# Patient Record
Sex: Female | Born: 2005 | Hispanic: Yes | Marital: Single | Smoking: Never smoker
Health system: Southern US, Community
[De-identification: ages and names within clinical notes are randomized; demographics above are authoritative.]

## PROBLEM LIST (undated history)

## (undated) ENCOUNTER — Inpatient Hospital Stay: Payer: Self-pay

## (undated) DIAGNOSIS — Z789 Other specified health status: Secondary | ICD-10-CM

## (undated) DIAGNOSIS — F419 Anxiety disorder, unspecified: Secondary | ICD-10-CM

## (undated) DIAGNOSIS — N289 Disorder of kidney and ureter, unspecified: Secondary | ICD-10-CM

## (undated) HISTORY — PX: NO PAST SURGERIES: SHX2092

## (undated) HISTORY — DX: Other specified health status: Z78.9

## (undated) HISTORY — DX: Anxiety disorder, unspecified: F41.9

---
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
## 2023-01-16 NOTE — Addendum Note (Signed)
Addended by: Heywood Bene on: 01/16/2023 01:12 PM   Modules accepted: Orders

## 2023-01-23 ENCOUNTER — Telehealth: Payer: Self-pay

## 2023-01-23 ENCOUNTER — Ambulatory Visit: Payer: Self-pay | Admitting: Advanced Practice Midwife

## 2023-01-23 VITALS — BP 106/68 | HR 105 | Temp 97.1°F | Wt 113.4 lb

## 2023-01-23 DIAGNOSIS — Z3402 Encounter for supervision of normal first pregnancy, second trimester: Secondary | ICD-10-CM

## 2023-01-23 DIAGNOSIS — O2342 Unspecified infection of urinary tract in pregnancy, second trimester: Secondary | ICD-10-CM

## 2023-01-23 DIAGNOSIS — F419 Anxiety disorder, unspecified: Secondary | ICD-10-CM

## 2023-01-23 DIAGNOSIS — O2340 Unspecified infection of urinary tract in pregnancy, unspecified trimester: Secondary | ICD-10-CM

## 2023-01-23 DIAGNOSIS — Z3401 Encounter for supervision of normal first pregnancy, first trimester: Secondary | ICD-10-CM

## 2023-01-23 NOTE — Progress Notes (Signed)
[facility] Spanish Interpretor utilized [place]). Confirmed patient's phone number and contact. Patient states she will set up voice mail. Patient states she did not tale antibiotic for UTI because on bottle there was a note "do not take in pregnancy". Provider notified.   BTHIELE RN

## 2023-01-23 NOTE — Progress Notes (Signed)
[facility] Department Maternal Health Clinic  PRENATAL VISIT NOTE  Subjective:  Taylor Molina is a 17 y.o. G1P0000 at [redacted]w[redacted]d being seen today for ongoing prenatal care.  She is currently monitored for the following issues for this low-risk pregnancy and has Encounter for supervision of normal first pregnancy in first trimester; Underweight BMI=16.7; Anxiety dx'd 2022; Rh negative status during pregnancy; Maternal varicella, non-immune; and UTI (urinary tract infection) during pregnancy 11/17/22 >100,000 E. Coli on their problem list.  Patient reports no complaints.  Contractions: Not present. Vag. Bleeding: None.  Movement: Present. Denies leaking of fluid/ROM.   The following portions of the patient's history were reviewed and updated as appropriate: allergies, current medications, past family history, past medical history, past social history, past surgical history and problem list. Problem list updated.  Objective:   Vitals:   01/23/23 0822  BP: 106/68  Pulse: (!) 105  Temp: (!) 97.1 F (36.2 C)  Weight: 113 lb 6.4 oz (51.4 kg)    Fetal Status: Fetal Heart Rate (bpm): 155 Fundal Height: 17 cm Movement: Present     General:  Alert, oriented and cooperative. Patient is in no acute distress.  Skin: Skin is warm and dry. No rash noted.   Cardiovascular: Normal heart rate noted  Respiratory: Normal respiratory effort, no problems with respiration noted  Abdomen: Soft, gravid, appropriate for gestational age.  Pain/Pressure: Absent     Pelvic: Cervical exam deferred        Extremities: Normal range of motion.  Edema: None  Mental Status: Normal mood and affect. Normal behavior. Normal judgment and thought content.   Assessment and Plan:  Pregnancy: G1P0000 at [redacted]w[redacted]d  1. Encounter for supervision of normal first pregnancy in first trimester 3 lb (1.361 kg) 3 lb wt gain in last 4 wks Nutrition counseling done MNT ordered NIPS 11/17/22=neg Reviewed Last u/s  11/25/22 at 9 1/7 wks with EDC=06/29/23 Anatomy u/s ordered Hasn't made dentist apt yet--strongly encouraged to do so Rising junior at [employer] Here with sister C/o difficulty falling asleep; suggestions given   - AFP, Serum, Open Spina Bifida - US OB Comp + 14 Wk; Future  2. Urinary tract infection in mother during pregnancy, antepartum Dx'd 11/17/22 and Bactrim e-rx'd on 11/21/22 which pt never picked up from pharmacy; counseled pt to begin taking today BID; counseled on potential risks/implications of not treating UTI in pregnancy C&S TOC after tx completed  3. Anxiety dx'd 2022 States wakes up a lot at night; suggestions given Declines interpreter   Preterm labor symptoms and general obstetric precautions including but not limited to vaginal bleeding, contractions, leaking of fluid and fetal movement were reviewed in detail with the patient. Please refer to After Visit Summary for other counseling recommendations.  Return in about 4 weeks (around 02/20/2023) for routine PNC.  Future Appointments  Date Time Provider Department Center  02/20/2023  8:20 AM [facility] PROVIDER [facility] None    Alberteen Spindle, CNM

## 2023-01-23 NOTE — Telephone Encounter (Signed)
Spoke to patient on sister's phone. Patient notified of date, time and location of U/S. Instructions given to arrive at 3:45 with full bladder. Questions answered. Reminded patient o take antibiotics for UTI. Patient asked if it was safe to take in pregnancy. Confirmed with provider that antibiotic safe for her to take. Patient notified that we were unable to leave voice message on her phone. She stated she will set up voice mail. BTHIELE RN

## 2023-01-23 NOTE — Telephone Encounter (Signed)
Patient called utilizing [employer] ID # [phone] to give date, time and location of U/S. Unable to [facility] BTHIELE RN.

## 2023-01-28 ENCOUNTER — Ambulatory Visit: Payer: 59 | Attending: Advanced Practice Midwife

## 2023-02-04 ENCOUNTER — Telehealth: Payer: Self-pay

## 2023-02-04 NOTE — Telephone Encounter (Signed)
Patient aware of rescheduled U/S on Monday 08/12,  arrival time 1545,  with full bladder. Roddie Mc, [facility] Spanish Interpretor, utilized. BTHIELE RN

## 2023-02-04 NOTE — Telephone Encounter (Signed)
Pt called to say that she went for her ultrsound but said they didn't have her on the schedule for an ultrasound, spoke to bonnie about the issue and was told she'd call her back

## 2023-02-09 ENCOUNTER — Ambulatory Visit: Payer: 59 | Attending: Advanced Practice Midwife

## 2023-02-20 ENCOUNTER — Ambulatory Visit: Payer: Self-pay | Admitting: Family Medicine

## 2023-02-20 VITALS — BP 115/66 | HR 109 | Temp 96.7°F | Wt 115.4 lb

## 2023-02-20 DIAGNOSIS — Z3402 Encounter for supervision of normal first pregnancy, second trimester: Secondary | ICD-10-CM

## 2023-02-20 DIAGNOSIS — O2342 Unspecified infection of urinary tract in pregnancy, second trimester: Secondary | ICD-10-CM

## 2023-02-20 DIAGNOSIS — Z3401 Encounter for supervision of normal first pregnancy, first trimester: Secondary | ICD-10-CM

## 2023-02-20 MED ORDER — ASPIRIN 81 MG PO TBEC: 81.0000 mg | DELAYED_RELEASE_TABLET | Freq: Every day | ORAL | Status: DC

## 2023-02-20 NOTE — Assessment & Plan Note (Signed)
Stable, normal vitals. FHR and fundal height within expected ranges.  - counseled ASA 81 mg daily, sample provided per standing order - patient unable to make it to anatomy US x2 due to transportation issues, rescheduled - next prenatal visit in 4 weeks.

## 2023-02-20 NOTE — Progress Notes (Signed)
[name]  PRENATAL VISIT NOTE Subjective:  Taylor Molina is a 17 y.o. G1P0000 at [redacted]w[redacted]d being seen today for ongoing prenatal care.  She is currently monitored for the following issues for this low-risk pregnancy and has Encounter for supervision of normal first pregnancy in first trimester; Underweight BMI=16.7; Anxiety dx'd 2022; Rh negative status during pregnancy; Maternal varicella, non-immune; and UTI (urinary tract infection) during pregnancy 11/17/22 >100,000 E. Coli on their problem list.  Patient reports normal fetal movement.  Denies leaking of fluid/ROM, vaginal bleeding, and contractions.    The following portions of the patient's history were reviewed and updated as appropriate: allergies, current medications, past family history, past medical history, past social history, past surgical history and problem list. Problem list updated.  Objective:   Vitals:   02/20/23 0817  BP: 115/66  Pulse: (!) 109  Temp: (!) 96.7 F (35.9 C)  Weight: 115 lb 6.4 oz (52.3 kg)    Fetal Status: Fetal Heart Rate (bpm): 150 Fundal Height: 22 cm Movement: Present     General:  Alert, oriented and cooperative. Patient is in no acute distress.  Skin: Skin is warm and dry. No rash noted.   Respiratory: Normal respiratory effort, no problems with respiration noted  Abdomen: Soft, gravid, appropriate for gestational age.  Pain/Pressure: Absent     Pelvic: Cervical exam deferred        Extremities: Normal range of motion.     Mental Status: Normal mood and affect. Normal behavior. Normal judgment and thought content.   Assessment and Plan:  Pregnancy: G1P0000 at [redacted]w[redacted]d Emmi was seen today for routine prenatal visit.  Encounter for supervision of normal first pregnancy in first trimester Overview:  Nursing Staff Provider  Office Location  [facility] Dating  On 11/25/22@9  1/7=06/29/23  Language  Spanish Anatomy US    Flu Vaccine  Declined  11/17/2022. Would like next flu season's vaccine Genetic Screen  NIPS: (negative- female) 11/17/22 AFP:  01/23/23=neg; First Screen:  Quad:    TDaP vaccine    Hgb A1C or  GTT Early -N/A Third trimester   COVID vaccine Declined 11/17/2022    Rhogam     LAB RESULTS   Feeding Plan Breastfeeding Blood Type   A negative          (11/17/2022)  Contraception Yes-undecided Antibody  Negative             (11/17/2022)  Circumcision  Rubella  Immune by titer   (11/17/2022)  Pediatrician  Undecided-Peds List given RPR   Non-reactive      (11/17/2022)  Support Person FOB-[facility] HBsAg   Negative           (11/17/2022)  Prenatal Classes  HIV  Non-reactive      (11/17/2022)  @28wk - Doula referral?  Varicella  Non-immune by titer    (11/17/2022)    HCV  Non-reactive       (11/17/2022)  BTL Consent  GBS  (For PCN allergy, check sensitivities)   ASA  Yes Quant Gold Negative             (11/17/2022)  VBAC Consent  Pap  Age 30    Hgb Electro  Normal Hgb        (11/17/2022)  BP Cuff ordered  CF   Delivery Group  AOB SMA   Centering Group  OBCM involved       Assessment & Plan: Stable, normal vitals. FHR and fundal height within expected  ranges.  - counseled ASA 81 mg daily, sample provided per standing order - patient unable to make it to anatomy US x2 due to transportation issues, rescheduled - next prenatal visit in 4 weeks.   Orders: -     Urine Culture -     Aspirin; Take 1 tablet (81 mg total) by mouth daily. Swallow whole.  Urinary tract infection in mother during second trimester of pregnancy Overview: Bactrim DS BID x 7 days per Dr. Alvester Morin- ordered 11/21/22- pt never picked up Pt hasn't started as of 01/23/23--noncompliant; to begin taking on 01/23/23 [ ]  TOC C&S at RV  Assessment & Plan: Clinically resolved. Patient reports she was able to obtain and complete the medication regimen as prescribed without any missed doses. No current urinary symptoms. Will obtain repeat OB urine culture to confirm  eradication of UTI.   Orders: -     Urine Culture   Preterm labor symptoms and general obstetric precautions including but not limited to vaginal bleeding, contractions, leaking of fluid and fetal movement were reviewed in detail with the patient. Please refer to After Visit Summary for other counseling recommendations.  No follow-ups on file.  Future Appointments  Date Time Provider Department Center  03/04/2023 11:00 AM [facility]US [facility]-Outpati  03/20/2023  9:00 AM AC-MH PROVIDER AC-MAT None   Clydene Fake, MD

## 2023-02-20 NOTE — Patient Instructions (Addendum)
I have translated the following text using Google translate.  As such, there are many errors.  I apologize for the poor written translation; however, we do not have written  translation services yet. It was wonderful to meet you today. Thank you for allowing me to be a part of your care. Below is a short summary of what we discussed at your visit today: He traducido el siguiente texto con el traductor de Microbiologist. Por lo tanto, hay muchos errores. Pido disculpas por la mala traduccin escrita; sin embargo, an no contamos con servicios de traduccin escrita. Fue maravilloso conocerte hoy. Gracias por permitirme ser parte de tu atencin. A continuacin, se incluye un breve resumen de lo que hablamos en tu visita de hoy:  Pregnancy Everything is looking good! Next prenatal appointment in 4 weeks. Make this appointment before you leave.   Start taking one aspirin 81 mg tablet every day to help prevent pre-eclampsia. See the hand out for more information.   Please go to your anatomy ultrasound as scheduled. Please call if there are any problems getting there.   Today we collected a urine sample to make sure your urine infection was cured by the antibiotic. If the results are normal, I will send you a letter or MyChart message. If the results are abnormal, I will give you a call.   Embarazo Todo se ve bien! La prxima cita prenatal es en 4 semanas. Haz esta cita antes de irte.  Comienza a tomar una tableta de aspirina de 81 mg [employer] para ayudar a prevenir la preeclampsia. Consulta el folleto para obtener ms informacin.  Acude a tu ecografa anatmica segn lo programado. Llama si tienes algn problema para llegar.  Hoy recolectamos una muestra de orina para asegurarnos de que el antibitico haya curado tu infeccin urinaria. Si los resultados son normales, te enviar una carta o un mensaje de Clinical cytogeneticist. Si los resultados son anormales, te Freight forwarder.  Recurso sobre exposiciones que podran  Loss adjuster, chartered: Mother To Baby es una organizacin sin fines de Visual merchandiser con mucha informacin [employer] efectos de muchos medicamentos y exposiciones en Firefighter. Es de uso gratuito, incluido su sitio web, lnea telefnica, servicio de mensajes de texto, aplicacin o correo electrnico y chat en vivo. [email or web address] Correo electrnico o chat en vivo: MotherToBaby.org Telfono: [phone] Mensaje de texto: [phone] Aplicacin: busque "LactRx"  Resource regarding exposures that could affect pregnancy: Mother To Pecola Le[employer] is a Dentist with lots of information on the effects of many medications and exposures on your pregnancy. It is free to use, including their web site, phone line, text service, app, or email and live chat.  [email or web address]/ Email or live chat: MotherToBaby.org Phone: [phone] Text: [phone] App: search "LactRx"  Recurso sobre exposiciones que podran afectar el embarazo: Mother To Baby es una organizacin sin fines de Visual merchandiser con mucha informacin [employer] efectos de muchos medicamentos y exposiciones en Firefighter. Es de uso gratuito, incluido su sitio web, lnea telefnica, servicio de mensajes de texto, aplicacin o correo electrnico y chat en vivo. [email or web address] Correo electrnico o chat en vivo: MotherToBaby.org Telfono: [phone] Mensaje de texto: [phone] Aplicacin: busque "LactRx"  Maternal Mental Health Salud mental materna Si comienza a desarrollar los siguientes sntomas de depresin, comunquese con nosotros para programar una cita. Tambin hay una lnea directa nacional de salud mental materna al [phone]). Esta lnea directa cuenta con asesores capacitados, doulas y parteras que brindan apoyo, informacin y Occupational hygienist real.  Psychologist, occupational  triste o Designer, multimedia parte del tiempo  Falta de inters en cosas que sola  disfrutar  Menos inters en cuidarse a s misma (vestirse, peinarse)  Dificultad para concentrarse  Dificultad para afrontar las tareas diarias  Preocupacin constante por su beb  Dormir o Microbiologist o muy poco  Sentirse muy ansiosa o nerviosa  Irritabilidad o enojo inexplicables  Pensamientos no deseados o aterradores  Sentirse que no es una buena madre  Pensamientos de Geographical information systems officer dao a s Visual merchandiser o a su beb  Si siente que est atravesando una crisis de salud mental, comunquese con la [employer] de Prevencin del Suicidio al [phone]) o vaya directamente a la A[place]cin de Urgencias de Salud Conductual del [place], Actuary las 24 horas, los 7 das de la [place]. [phone] [place], [place] [address]   Fayette Pho, MD

## 2023-02-20 NOTE — Assessment & Plan Note (Addendum)
Clinically resolved. Patient reports she was able to obtain and complete the medication regimen as prescribed without any missed doses. No current urinary symptoms. Will obtain repeat OB urine culture to confirm eradication of UTI.

## 2023-02-20 NOTE — Progress Notes (Signed)
Patient states did not go to U/S scheduled on 02/09/2023. Pediatrician yet undecided by patient; she states she has Ped List at home. Interpreter utilized [employer] [facility]).  The patient was dispensed Baby Aspirin today. I provided counseling today regarding the medication. We discussed the medication, the side effects and when to call clinic. Patient given the opportunity to ask questions. Questions answered.  Patient aware of rescheduled U/S date, time and location. Map and instructions given.  BTHIELE RN

## 2023-02-22 LAB — URINE CULTURE: Organism ID, Bacteria: NO GROWTH

## 2023-03-04 ENCOUNTER — Ambulatory Visit
Admission: RE | Admit: 2023-03-04 | Discharge: 2023-03-04 | Disposition: A | Discharge status: Home / Self Care | Payer: 59 | Source: Ambulatory Visit | Attending: Advanced Practice Midwife | Admitting: Advanced Practice Midwife

## 2023-03-04 DIAGNOSIS — Z3401 Encounter for supervision of normal first pregnancy, first trimester: Secondary | ICD-10-CM | POA: Diagnosis present

## 2023-03-04 DIAGNOSIS — Z3402 Encounter for supervision of normal first pregnancy, second trimester: Secondary | ICD-10-CM | POA: Insufficient documentation

## 2023-03-04 DIAGNOSIS — Z3689 Encounter for other specified antenatal screening: Secondary | ICD-10-CM | POA: Diagnosis not present

## 2023-03-04 DIAGNOSIS — Z3A22 22 weeks gestation of pregnancy: Secondary | ICD-10-CM | POA: Diagnosis not present

## 2023-03-17 ENCOUNTER — Encounter: Payer: Self-pay | Admitting: Advanced Practice Midwife

## 2023-03-20 ENCOUNTER — Ambulatory Visit: Payer: Self-pay | Admitting: Advanced Practice Midwife

## 2023-03-20 VITALS — BP 106/68 | HR 89 | Temp 98.2°F | Wt 119.0 lb

## 2023-03-20 DIAGNOSIS — Z3401 Encounter for supervision of normal first pregnancy, first trimester: Secondary | ICD-10-CM

## 2023-03-20 DIAGNOSIS — O2340 Unspecified infection of urinary tract in pregnancy, unspecified trimester: Secondary | ICD-10-CM

## 2023-03-20 DIAGNOSIS — Z3402 Encounter for supervision of normal first pregnancy, second trimester: Secondary | ICD-10-CM

## 2023-03-20 DIAGNOSIS — F419 Anxiety disorder, unspecified: Secondary | ICD-10-CM

## 2023-03-20 DIAGNOSIS — R636 Underweight: Secondary | ICD-10-CM

## 2023-03-20 DIAGNOSIS — O2342 Unspecified infection of urinary tract in pregnancy, second trimester: Secondary | ICD-10-CM

## 2023-03-20 NOTE — Progress Notes (Signed)
Patient states she will bring name of Pediatrician next visit. Patient offered Flu vaccine. VIS given however undecided whether will receive this season's flu vaccine. [facility] Spanish Interpreter utilized this visit Sharmon Revere). Patient undecided on method of birth control. BTHIELE RN

## 2023-03-20 NOTE — Progress Notes (Signed)
[facility] Department Maternal Health Clinic  PRENATAL VISIT NOTE  Subjective:  Taylor Molina is a 17 y.o. G1P0000 at [redacted]w[redacted]d being seen today for ongoing prenatal care.  She is currently monitored for the following issues for this low-risk pregnancy and has Encounter for supervision of normal first pregnancy in first trimester; Underweight BMI=16.7; Anxiety dx'd 2022; Rh negative status during pregnancy; Maternal varicella, non-immune; and UTI (urinary tract infection) during pregnancy 11/17/22 >100,000 E. Coli on their problem list.  Patient reports no complaints.  Contractions: Not present. Vag. Bleeding: None.  Movement: Present. Denies leaking of fluid/ROM.   The following portions of the patient's history were reviewed and updated as appropriate: allergies, current medications, past family history, past medical history, past social history, past surgical history and problem list. Problem list updated.  Objective:   Vitals:   03/20/23 0938  BP: 106/68  Pulse: 89  Temp: 98.2 F (36.8 C)  Weight: 119 lb (54 kg)    Fetal Status: Fetal Heart Rate (bpm): 133 Fundal Height: 26 cm Movement: Present     General:  Alert, oriented and cooperative. Patient is in no acute distress.  Skin: Skin is warm and dry. No rash noted.   Cardiovascular: Normal heart rate noted  Respiratory: Normal respiratory effort, no problems with respiration noted  Abdomen: Soft, gravid, appropriate for gestational age.  Pain/Pressure: Absent     Pelvic: Cervical exam deferred        Extremities: Normal range of motion.  Edema: None  Mental Status: Normal mood and affect. Normal behavior. Normal judgment and thought content.   Assessment and Plan:  Pregnancy: G1P0000 at [redacted]w[redacted]d  1. Urinary tract infection in mother during pregnancy, antepartum TOC C&S 02/20/23 neg  2. 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 Underweight BMI=16.7 Had MNT apt 03/12/23  4. Anxiety dx'd 2022 Denies need for counseling   Preterm labor symptoms and general obstetric precautions including but not limited to vaginal bleeding, contractions, leaking of fluid and fetal movement were reviewed in detail with the patient. Please refer to After Visit Summary for other counseling recommendations.  Return in about 2 weeks (around 04/03/2023) for 28 week labs, routine PNC.  Future Appointments  Date Time Provider Department Center  04/03/2023 10:00 AM AC-MH PROVIDER AC-MAT None    Alberteen Spindle, CNM

## 2023-04-03 ENCOUNTER — Telehealth: Payer: Self-pay

## 2023-04-03 ENCOUNTER — Ambulatory Visit: Payer: Self-pay | Admitting: Family Medicine

## 2023-04-03 VITALS — BP 122/69 | HR 105 | Temp 97.3°F | Wt 119.8 lb

## 2023-04-03 DIAGNOSIS — Z3401 Encounter for supervision of normal first pregnancy, first trimester: Secondary | ICD-10-CM

## 2023-04-03 DIAGNOSIS — Z362 Encounter for other antenatal screening follow-up: Secondary | ICD-10-CM

## 2023-04-03 DIAGNOSIS — Z6791 Unspecified blood type, Rh negative: Secondary | ICD-10-CM

## 2023-04-03 DIAGNOSIS — Z3403 Encounter for supervision of normal first pregnancy, third trimester: Secondary | ICD-10-CM

## 2023-04-03 DIAGNOSIS — O26893 Other specified pregnancy related conditions, third trimester: Secondary | ICD-10-CM

## 2023-04-03 LAB — HEMOGLOBIN, FINGERSTICK: Hemoglobin: 11.6 g/dL (ref 11.1–15.9)

## 2023-04-03 NOTE — Telephone Encounter (Signed)
Utilized [facility] Spanish Interpreter to call patient (x 2), patient's spouse Durel Salts) and patient's mother nbut unable to leave message. Patient has U/s appointment 04/09/2023 @ 2:30 pm (arrival time 2:15) @ [facility]. BTHIELE RN

## 2023-04-03 NOTE — Progress Notes (Signed)
Patient declines TDAP and Rhogam until mother can accompany her to appointment. Appointment scheduled for next week and f/u return in 2 weeks.  TDAP VIS given. In house lab reviewed during visit. BTHIELE RN

## 2023-04-03 NOTE — Progress Notes (Signed)
[facility] Health Department Maternal Health Clinic  PRENATAL VISIT NOTE  Subjective:  Taylor Molina is a 17 y.o. G1P0000 at [redacted]w[redacted]d being seen today for ongoing prenatal care.  She is currently monitored for the following issues for this low-risk pregnancy and has Encounter for supervision of normal first pregnancy in first trimester; Underweight BMI=16.7; Anxiety dx'd 2022; Rh negative status during pregnancy; Maternal varicella, non-immune; and UTI (urinary tract infection) during pregnancy 11/17/22 >100,000 E. Coli on their problem list.  Patient reports no complaints.  Contractions: Not present. Vag. Bleeding: None.  Movement: Present. Denies leaking of fluid/ROM.   The following portions of the patient's history were reviewed and updated as appropriate: allergies, current medications, past family history, past medical history, past social history, past surgical history and problem list. Problem list updated.  Objective:   Vitals:   04/03/23 1020  BP: 122/69  Pulse: (!) 105  Temp: (!) 97.3 F (36.3 C)  Weight: 119 lb 12.8 oz (54.3 kg)    Fetal Status: Fetal Heart Rate (bpm): 155 Fundal Height: 28 cm Movement: Present     General:  Alert, oriented and cooperative. Patient is in no acute distress.  Skin: Skin is warm and dry. No rash noted.   Cardiovascular: Normal heart rate noted  Respiratory: Normal respiratory effort, no problems with respiration noted  Abdomen: Soft, gravid, appropriate for gestational age.  Pain/Pressure: Absent     Pelvic: Cervical exam deferred        Extremities: Normal range of motion.  Edema: None  Mental Status: Normal mood and affect. Normal behavior. Normal judgment and thought content.   Assessment and Plan:  Pregnancy: G1P0000 at [redacted]w[redacted]d  1. Encounter for supervision of normal first pregnancy in first trimester Pt reports not taking PNV or ASA because she was "bored" of taking it every day. Reinforced the importance taking  daily. Pt asks if she will have a vaginal delivery or c-section. Discussed indications for c-section and defaulting to vaginal delivery. Pt declines TDaP at this visit. Would like her mother to be present. Also discussed that FOB should receive TDaP as he has not had it in the last 10 years. Reviewed insufficient heart views on [place] and sending r/p [place] today. Pt verbalized understanding. 9 lb 6.4 oz (4.264 kg) 0lb weight gain from last visit Pt endorses eating and snacking with fruit. Encouraged eating large healthy meals with protein. Given examples of protein. Pt had MNT visit 03/12/23  Please give PHQ-9 and CCNC at next visit.  - Antibody screen - Glucose, 1 hour gestational - RPR - HIV-1/HIV-2 Qualitative RNA - Hemoglobin, venipuncture  2. Rh negative status during pregnancy in third trimester Pt declines rhogam at this time because she would like her mother to be present. Discussed what rhogam is and why it is important. Given strict bleeding precautions. - Antibody screen   Preterm labor symptoms and general obstetric precautions including but not limited to vaginal bleeding, contractions, leaking of fluid and fetal movement were reviewed in detail with the patient. Please refer to After Visit Summary for other counseling recommendations.  Return in about 1 week (around 04/10/2023) for Routine PNC, TDaP and rhogam.  Due to language barrier, an interpreter Sharmon Revere) was present during the history-taking and subsequent discussion (and for part of the physical exam) with this patient.   Future Appointments  Date Time Provider Department Center  04/07/2023  3:15 PM AC-MH NURSE AC-MAT None  04/17/2023  8:20 AM AC-MH PROVIDER AC-MAT None  Alicia Amel, NP

## 2023-04-05 LAB — HIV-1/HIV-2 QUALITATIVE RNA
HIV-1 RNA, Qualitative: NONREACTIVE
HIV-2 RNA, Qualitative: NONREACTIVE

## 2023-04-05 LAB — GLUCOSE, 1 HOUR GESTATIONAL: Gestational Diabetes Screen: 71 mg/dL (ref 70–139)

## 2023-04-05 LAB — RPR: RPR Ser Ql: NONREACTIVE

## 2023-04-05 LAB — ANTIBODY SCREEN: Antibody Screen: NEGATIVE

## 2023-04-07 ENCOUNTER — Other Ambulatory Visit: Payer: Self-pay

## 2023-04-07 NOTE — Telephone Encounter (Signed)
Attempted to reach patient utilizing Spanish Interpretor [facility] ID # [phone]). No voice mail set up for patient or her significant other phone # Durel Salts). Patient needs date, time and location of U./S 04/09/2023. BTHIELE RN

## 2023-04-07 NOTE — Telephone Encounter (Signed)
Client [facility] for Tdap and Rhophylac in Nurse Clinic 04/07/23. Call to client with [employer] to reschedule missed appt AND notify her of 04/09/23 [place] appt. Per recorded message on voice mail of client / partner / mother, number is currently not available. [facility] then had fire alarm prior to calling another emergency contact.  On return to clinic after fire drill, call to client with [employer] and able to contact sister. Requested sister assist RN in contacting client to call for [place] appt and to reschedule today's missed appt for Tdap / Rhophylac. Number to call provided. [name], RN

## 2023-04-08 NOTE — Telephone Encounter (Signed)
Note received from Carren Rang this am that client had returned call from yesterday and was waiting on RN call at [phone]. Call made to client at that number with Est[employer] Dooms and per recorded message, number is not available. Re-dialed number with same message. Call then made to emergency contact (sister) who states client is with her.  Client counseled regarding 04/09/23 [place] appt with arrival time of 2:15 pm. [facility] address provided to client.  Yesterday's missed appt for Tdap and Rhophylac also rescheduled for Monday (client preference), 10//14/24 in Nurse Clinic. [name], RN

## 2023-04-09 ENCOUNTER — Ambulatory Visit
Admission: RE | Admit: 2023-04-09 | Discharge: 2023-04-09 | Disposition: A | Discharge status: Home / Self Care | Payer: 59 | Source: Ambulatory Visit | Attending: Nurse Practitioner | Admitting: Nurse Practitioner

## 2023-04-09 ENCOUNTER — Other Ambulatory Visit: Payer: Self-pay | Admitting: Nurse Practitioner

## 2023-04-09 DIAGNOSIS — Z362 Encounter for other antenatal screening follow-up: Secondary | ICD-10-CM | POA: Diagnosis present

## 2023-04-09 DIAGNOSIS — Z6791 Unspecified blood type, Rh negative: Secondary | ICD-10-CM

## 2023-04-09 DIAGNOSIS — Z3401 Encounter for supervision of normal first pregnancy, first trimester: Secondary | ICD-10-CM

## 2023-04-13 ENCOUNTER — Ambulatory Visit: Payer: Self-pay | Admitting: Family Medicine

## 2023-04-13 ENCOUNTER — Ambulatory Visit: Payer: Self-pay

## 2023-04-13 ENCOUNTER — Other Ambulatory Visit: Payer: Self-pay

## 2023-04-13 VITALS — BP 127/75 | HR 107 | Temp 97.3°F | Wt 119.0 lb

## 2023-04-13 DIAGNOSIS — O26893 Other specified pregnancy related conditions, third trimester: Secondary | ICD-10-CM

## 2023-04-13 DIAGNOSIS — Z6791 Unspecified blood type, Rh negative: Secondary | ICD-10-CM

## 2023-04-13 DIAGNOSIS — Z3401 Encounter for supervision of normal first pregnancy, first trimester: Secondary | ICD-10-CM

## 2023-04-13 MED ORDER — RHO D IMMUNE GLOBULIN 1500 UNIT/2ML IJ SOSY
300.0000 ug | PREFILLED_SYRINGE | Freq: Once | INTRAMUSCULAR | Status: AC
Start: 2023-04-13 — End: 2023-04-13
  Administered 2023-04-13: 300 ug via INTRAMUSCULAR

## 2023-04-13 NOTE — Progress Notes (Unsigned)
Patient declined TDAp until next visit. Patient tearful prior to Rhophylac injection, emotional support given. Patient tolerated injection well. BTHIELE RN

## 2023-04-14 ENCOUNTER — Telehealth: Payer: Self-pay

## 2023-04-14 NOTE — Telephone Encounter (Signed)
Call to client with Marlene Yemen as noted she did not have a [facility] RV follow-up appt. Per client, she has an appt 04/17/23 at 0800 written on her white card. Client scheduled for that date / time. [name], RN

## 2023-04-17 ENCOUNTER — Ambulatory Visit: Payer: Self-pay | Admitting: Advanced Practice Midwife

## 2023-04-17 ENCOUNTER — Ambulatory Visit: Payer: Self-pay

## 2023-04-17 VITALS — BP 115/71 | HR 99 | Temp 97.0°F | Wt 122.8 lb

## 2023-04-17 DIAGNOSIS — R636 Underweight: Secondary | ICD-10-CM

## 2023-04-17 DIAGNOSIS — O26899 Other specified pregnancy related conditions, unspecified trimester: Secondary | ICD-10-CM

## 2023-04-17 DIAGNOSIS — Z3401 Encounter for supervision of normal first pregnancy, first trimester: Secondary | ICD-10-CM

## 2023-04-17 DIAGNOSIS — Z6791 Unspecified blood type, Rh negative: Secondary | ICD-10-CM

## 2023-04-17 DIAGNOSIS — Z3403 Encounter for supervision of normal first pregnancy, third trimester: Secondary | ICD-10-CM

## 2023-04-17 NOTE — Progress Notes (Signed)
[facility] Health Department Maternal Health Clinic  PRENATAL VISIT NOTE  Subjective:  Taylor Molina is a 17 y.o. G1P0000 at [redacted]w[redacted]d being seen today for ongoing prenatal care accompanied by FOB.  She is currently monitored for the following issues for this low-risk pregnancy and has Encounter for supervision of normal first pregnancy in first trimester; Underweight BMI=16.7; Anxiety dx'd 2022; Rh negative status during pregnancy; Maternal varicella, non-immune; and UTI (urinary tract infection) during pregnancy 11/17/22 >100,000 E. Coli on their problem list.  Patient reports no complaints.  Contractions: Not present. Vag. Bleeding: None.  Movement: Present. She reports that the baby moves a lot and wakes her up at 3am, but she is able to get back to sleep about an hour later. Denies leaking of fluid/ROM.   The following portions of the patient's history were reviewed and updated as appropriate: allergies, current medications, past family history, past medical history, past social history, past surgical history and problem list. Problem list updated.  Objective:   Vitals:   04/17/23 0824  BP: 115/71  Pulse: 99  Temp: (!) 97 F (36.1 C)  Weight: 122 lb 12.8 oz (55.7 kg)    Fetal Status: Fetal Heart Rate (bpm): 140 Fundal Height: 29 cm Movement: Present     General:  Alert, oriented and cooperative. Patient is in no acute distress.  Skin: Skin is warm and dry. No rash noted.   Cardiovascular: Normal heart rate noted  Respiratory: Normal respiratory effort, no problems with respiration noted  Abdomen: Soft, gravid, appropriate for gestational age.  Pain/Pressure: Absent     Pelvic: Cervical exam deferred        Extremities: Normal range of motion.  Edema: None  Mental Status: Normal mood and affect. Normal behavior. Normal judgment and thought content.   Assessment and Plan:  Pregnancy: G1P0000 at [redacted]w[redacted]d  1. Encounter for supervision of normal first pregnancy in first  trimester 12 lb 6.4 oz (5.625 kg) Gained 3lbs since last visit 2 weeks ago Reviewed 04/09/23 [place] with patient. EFW 35% AFI 12.5 cm, baby breech. Discussed that they were still unable to visualize RVOT, offered to send pt to MFM to confirm normal heart views. Pt and FOB unsure. Asked pt to let us know if she would like another r/p [place] for heart views, will wait to send referral until pt requests. Reviewed 28 wk labs w pt. TDaP given. Encouraged FOB to get TDaP as well. Taking ASA and PNV daily.  2. Rh negative, antepartum Rhogam given 10/14  3. Underweight BMI=16.7   Preterm labor symptoms and general obstetric precautions including but not limited to vaginal bleeding, contractions, leaking of fluid and fetal movement were reviewed in detail with the patient. Please refer to After Visit Summary for other counseling recommendations.  Due to language barrier, an interpreter, Roddie Mc, was present during the history-taking and subsequent discussion (and for part of the physical exam) with this patient.  Return in about 2 weeks (around 05/01/2023) for Routine PNC.  Future Appointments  Date Time Provider Department Center  05/01/2023  9:00 AM AC-MH PROVIDER AC-MAT None    Alicia Amel, NP

## 2023-04-28 NOTE — Addendum Note (Signed)
Addended by: Heywood Bene on: 04/28/2023 10:35 AM   Modules accepted: Orders

## 2023-05-01 ENCOUNTER — Ambulatory Visit: Payer: Self-pay | Admitting: Family Medicine

## 2023-05-01 VITALS — BP 126/72 | HR 101 | Temp 97.4°F | Wt 124.0 lb

## 2023-05-01 DIAGNOSIS — Z3401 Encounter for supervision of normal first pregnancy, first trimester: Secondary | ICD-10-CM

## 2023-05-01 DIAGNOSIS — Z3403 Encounter for supervision of normal first pregnancy, third trimester: Secondary | ICD-10-CM

## 2023-05-01 DIAGNOSIS — Z3A31 31 weeks gestation of pregnancy: Secondary | ICD-10-CM

## 2023-05-01 DIAGNOSIS — R636 Underweight: Secondary | ICD-10-CM

## 2023-05-01 NOTE — Progress Notes (Signed)
[facility] Health Department Maternal Health Clinic  PRENATAL VISIT NOTE  Subjective:  Taylor Molina is a 17 y.o. G1P0000 at [redacted]w[redacted]d being seen today for ongoing prenatal care.  She is currently monitored for the following issues for this low-risk pregnancy and has Encounter for supervision of normal first pregnancy in first trimester; Underweight BMI=16.7; Anxiety dx'd 2022; Rh negative status during pregnancy; Maternal varicella, non-immune; and UTI (urinary tract infection) during pregnancy 11/17/22 >100,000 E. Coli on their problem list.  Patient reports no complaints.  Contractions: Not present. Vag. Bleeding: None.  Movement: Present. Denies leaking of fluid/ROM.   The following portions of the patient's history were reviewed and updated as appropriate: allergies, current medications, past family history, past medical history, past social history, past surgical history and problem list. Problem list updated.  Objective:   Vitals:   05/01/23 0859  BP: 126/72  Pulse: (!) 101  Temp: (!) 97.4 F (36.3 C)  Weight: 124 lb (56.2 kg)    Fetal Status: Fetal Heart Rate (bpm): 154 Fundal Height: 30 cm Movement: Present     General:  Alert, oriented and cooperative. Patient is in no acute distress.  Skin: Skin is warm and dry. No rash noted.   Cardiovascular: Normal heart rate noted  Respiratory: Normal respiratory effort, no problems with respiration noted  Abdomen: Soft, gravid, appropriate for gestational age.  Pain/Pressure: Absent     Pelvic: Cervical exam deferred        Extremities: Normal range of motion.  Edema: None  Mental Status: Normal mood and affect. Normal behavior. Normal judgment and thought content.   Assessment and Plan:  Pregnancy: G1P0000 at [redacted]w[redacted]d  1. Encounter for supervision of normal first pregnancy in first trimester -reports taking PNV and ASA daily 13 lb 9.6 oz (6.169 kg) -has not gotten crib or car seat yet- encouraged to do so  soon -reviewed having a bag packed for hospital by 36 weeks -I did not discuss with patient if they desire repeat US- has already had two but poor visualization of certain structures, ask at RV  2. Underweight BMI=16.7 -FH appropriate, has gained 2 # in the last 2 weeks  3. [redacted] weeks gestation of pregnancy    Preterm labor symptoms and general obstetric precautions including but not limited to vaginal bleeding, contractions, leaking of fluid and fetal movement were reviewed in detail with the patient. Please refer to After Visit Summary for other counseling recommendations.  Return in about 2 weeks (around 05/15/2023) for Routine Prenatal Care.  No future appointments. Due to language barrier, interpreter Estill Dooms was present for this visit.  Lenice Llamas, [place]

## 2023-05-01 NOTE — Progress Notes (Addendum)
Declined Flu vaccine.  BTHIELE RN

## 2023-05-15 ENCOUNTER — Ambulatory Visit: Payer: Self-pay | Admitting: Family Medicine

## 2023-05-15 ENCOUNTER — Telehealth: Payer: Self-pay

## 2023-05-15 VITALS — BP 115/70 | HR 115 | Temp 97.3°F | Wt 123.8 lb

## 2023-05-15 DIAGNOSIS — R636 Underweight: Secondary | ICD-10-CM

## 2023-05-15 DIAGNOSIS — Z3403 Encounter for supervision of normal first pregnancy, third trimester: Secondary | ICD-10-CM

## 2023-05-15 DIAGNOSIS — Z3401 Encounter for supervision of normal first pregnancy, first trimester: Secondary | ICD-10-CM

## 2023-05-15 NOTE — Progress Notes (Signed)
MFM [place] appt scheduled and will notify client of appt via phone call. [name], RN

## 2023-05-15 NOTE — Telephone Encounter (Signed)
Call to client with [facility] Interpreters ID [phone] to notify her of 06/10/23 [place] at 0800 with arrival time of 0745. Appt is at [facility] in [place]. This is first available appt at both MFM locations. Per recorded message, number is not available and no voicemail set up. Call then made to emergency contacts (partner, sister, mother) and per recorded message on each phone, number is not available. [name], RN

## 2023-05-15 NOTE — Addendum Note (Signed)
Addended by: Lenice Llamas on: 05/15/2023 11:09 AM   Modules accepted: Orders

## 2023-05-15 NOTE — Progress Notes (Signed)
[facility] Health Department Maternal Health Clinic  PRENATAL VISIT NOTE  Subjective:  Taylor Molina is a 17 y.o. G1P0000 at [redacted]w[redacted]d being seen today for ongoing prenatal care.  She is currently monitored for the following issues for this low-risk pregnancy and has Encounter for supervision of normal first pregnancy in first trimester; Underweight BMI=16.7; Anxiety dx'd 2022; Rh negative status during pregnancy; Maternal varicella, non-immune; and UTI (urinary tract infection) during pregnancy 11/17/22 >100,000 E. Coli on their problem list.  Patient reports no complaints.  Contractions: Not present. Vag. Bleeding: None.  Movement: Present. Denies leaking of fluid/ROM.   The following portions of the patient's history were reviewed and updated as appropriate: allergies, current medications, past family history, past medical history, past social history, past surgical history and problem list. Problem list updated.  Objective:   Vitals:   05/15/23 1011 05/15/23 1050  BP: 115/70   Pulse: (!) 129 (!) 115  Temp: (!) 97.3 F (36.3 C)   Weight: 123 lb 12.8 oz (56.2 kg)     Fetal Status: Fetal Heart Rate (bpm): 139 Fundal Height: 31 cm Movement: Present     General:  Alert, oriented and cooperative. Patient is in no acute distress.  Skin: Skin is warm and dry. No rash noted.   Cardiovascular: Normal heart rate noted  Respiratory: Normal respiratory effort, no problems with respiration noted  Abdomen: Soft, gravid, appropriate for gestational age.  Pain/Pressure: Absent     Pelvic: Cervical exam deferred        Extremities: Normal range of motion.  Edema: None  Mental Status: Normal mood and affect. Normal behavior. Normal judgment and thought content.   Assessment and Plan:  Pregnancy: G1P0000 at [redacted]w[redacted]d  1. Encounter for supervision of normal first pregnancy in first trimester -Pt reports she got the crib and car seat since the last visit  -last two ultrasounds have had  poor visualization of certain structures (RVOT) - gone twice to [facility]- offered repeat US- reviewed cost- patient decided she would like to have another [place] at a different place- will order to [facility]- patient is worried abotu the cost and after some debate- decides she wants it ordered  -HR elevated on initial VS- at 129, lowered down to 115 at end of visit, this is closer to patient's baseline -taking PNV daily  2. Underweight BMI=16.7 13 lb 6.4 oz (6.078 kg) -lost a little bit of weight- denies NV, and reports she is eating well -encouraged to increase protein and healthy fat intake    Preterm labor symptoms and general obstetric precautions including but not limited to vaginal bleeding, contractions, leaking of fluid and fetal movement were reviewed in detail with the patient. Please refer to After Visit Summary for other counseling recommendations.  No follow-ups on file.  Future Appointments  Date Time Provider Department Center  06/01/2023  9:00 AM AC-MH PROVIDER AC-MAT None   Due to language barrier, interpreter Daniella [phone]) was present for this visit.  Lenice Llamas, [place]

## 2023-05-20 NOTE — Telephone Encounter (Signed)
Call to client with Taylor Molina and provided [place] appt of 06/10/23 at 0800 with arrival time of 0745 at [facility] in [place]. Client provided facility name and given address of [facility]. Following call, RN realized office  / [id number] not provided to client and [name] calling client to give her this information. [name], RN

## 2023-06-01 ENCOUNTER — Encounter: Payer: Self-pay | Admitting: Family Medicine

## 2023-06-01 ENCOUNTER — Ambulatory Visit: Payer: Self-pay | Admitting: Family Medicine

## 2023-06-01 VITALS — BP 113/68 | HR 103 | Temp 98.0°F | Wt 126.8 lb

## 2023-06-01 DIAGNOSIS — Z3403 Encounter for supervision of normal first pregnancy, third trimester: Secondary | ICD-10-CM

## 2023-06-01 DIAGNOSIS — Z3401 Encounter for supervision of normal first pregnancy, first trimester: Secondary | ICD-10-CM

## 2023-06-01 DIAGNOSIS — O2343 Unspecified infection of urinary tract in pregnancy, third trimester: Secondary | ICD-10-CM

## 2023-06-01 NOTE — Patient Instructions (Addendum)
I have translated the following text using Google translate.  As such, there are many errors.  I apologize for the poor written translation; however, we do not have written  translation services yet. It was wonderful to see you today. Thank you for allowing me to be a part of your care. Below is a short summary of what we discussed at your visit today: He traducido el siguiente texto con el traductor de Microbiologist. Por lo tanto, hay muchos errores. Pido disculpas por la mala traduccin escrita; sin embargo, an no contamos con servicios de traduccin escrita. Fue maravilloso verte hoy. Gracias por permitirme ser parte de tu atencin. A continuacin, se incluye un breve resumen de lo que hablamos en tu visita de hoy:  Pregnancy Swabs were collected today to test for Group B strep bacteria, gonorrhea, and chlamydia.  You will need weekly appointments until you deliver.  Keep taking your prenatal vitamins.  Sherlon Handing se tomaron muestras para Landscape architect presencia de bacterias del estreptococo del [place] B, gonorrea y clamidia. Necesitars citas semanales hasta el parto. Sigue tomando tus vitaminas prenatales.  Prenatal Classes Go to [email or web address] for more information on the pregnancy and child birth classes that [facility] has to offer.   Clases prenatales Visita [email or web address] para obtener ms informacin sobre las clases de Psychiatrist y parto que ofrece [place].  Maternal Mental Health Salud Mental Materna Si comienza a desarrollar los siguientes sntomas de depresin, comunquese con nosotros para programar una cita. Tambin hay una lnea directa nacional de salud mental materna en el [phone]). Esta lnea directa ha capacitado a consejeros, doulas y parteras para brindar apoyo, informacin y Occupational hygienist real.  [place] Sizer triste o sin esperanza la mayor parte del tiempo  Falta de inters  en las cosas que sola disfrutar  Menos inters en cuidarse a s mismo (vestirse, arreglarse el cabello)  Problemas para concentrarse  Problemas para hacer frente a las tareas diarias  Preocupacin constante por su beb  Dormir o Microbiologist o muy poco  Sentirse muy ansioso o nervioso  Irritabilidad o ira inexplicables  Pensamientos no deseados o Insurance underwriter que no eres una buena madre  Pensamientos de Runner, broadcasting/film/video a s Visual merchandiser o a su beb  Si cree que est experimentando una crisis de salud mental, comunquese con la [employer] de Prevencin del Suicidio al 1-800-273-TALK [phone]).    Fayette Pho, MD [facility] Department

## 2023-06-01 NOTE — Assessment & Plan Note (Signed)
TOC negative August 2024, will resolve from active problem list.

## 2023-06-01 NOTE — Assessment & Plan Note (Signed)
Taking asa and PNV. Doing well. BP normal. TWG 16 lb. GBS and GC/CT swabs collected today. Has car seat and crib.

## 2023-06-01 NOTE — Progress Notes (Signed)
Patient here for MH RV at 36 weeks. 36 weeks labs and packet today. Patient self-collecting labs. Burt Knack, RN   Patient decided she would like provider to collect labs today. Aware of 06/10/23 U/S at [facility] MFM.Marland KitchenBurt Knack, RN

## 2023-06-01 NOTE — Progress Notes (Signed)
[facility] Department Maternal Health Clinic  PRENATAL VISIT NOTE Phone Spanish interpreter Shawna Orleans WG#[id number] used for visit Subjective:  Taylor Molina is a 17 y.o. G1P0000 at [redacted]w[redacted]d being seen today for ongoing prenatal care.  She is currently monitored for the following issues for this low-risk pregnancy and has Encounter for supervision of normal first pregnancy in first trimester; Underweight BMI=16.7; Anxiety dx'd 2022; Rh negative status during pregnancy; and Maternal varicella, non-immune on their problem list.  Patient reports no complaints.  Contractions: Not present. Vag. Bleeding: None.  Movement: Present. Denies leaking of fluid/ROM.   The following portions of the patient's history were reviewed and updated as appropriate: allergies, current medications, past family history, past medical history, past social history, past surgical history and problem list. Problem list updated.  Objective:   Vitals:   06/01/23 0908  BP: 113/68  Pulse: (!) 103  Temp: 98 F (36.7 C)  Weight: 126 lb 12.8 oz (57.5 kg)   Fetal Status: Fetal Heart Rate (bpm): 156 Fundal Height: 35 cm Movement: Present     General:  Alert, oriented and cooperative. Patient is in no acute distress.  Skin: Skin is warm and dry. No rash noted.   Cardiovascular: Normal heart rate noted  Respiratory: Normal respiratory effort, no problems with respiration noted  Abdomen: Soft, gravid, appropriate for gestational age.  Pain/Pressure: Present (positional, relieved with movement)     Pelvic: Cervical exam deferred        Extremities: Normal range of motion.     Mental Status: Normal mood and affect. Normal behavior. Normal judgment and thought content.   Assessment and Plan:  Pregnancy: G1P0000 at [redacted]w[redacted]d  Encounter for supervision of normal first pregnancy in first trimester Assessment & Plan: Taking asa and PNV. Doing well. BP normal. TWG 16 lb. GBS and GC/CT swabs collected today. Has car  seat and crib.   Orders: -     Chlamydia/GC NAA, Confirmation -     Culture, beta strep (group b only)  Urinary tract infection in mother during third trimester of pregnancy Assessment & Plan: TOC negative August 2024, will resolve from active problem list.    Term labor symptoms and general obstetric precautions including but not limited to vaginal bleeding, contractions, leaking of fluid and fetal movement were reviewed in detail with the patient. Please refer to After Visit Summary for other counseling recommendations.  No follow-ups on file.  Future Appointments  Date Time Provider Department Center  06/10/2023  8:00 AM [facility]   Clydene Fake, MD

## 2023-06-05 LAB — CHLAMYDIA/GC NAA, CONFIRMATION
Chlamydia trachomatis, NAA: NEGATIVE
Neisseria gonorrhoeae, NAA: NEGATIVE

## 2023-06-05 LAB — CULTURE, BETA STREP (GROUP B ONLY): Strep Gp B Culture: NEGATIVE

## 2023-06-07 ENCOUNTER — Observation Stay
Admission: EM | Admit: 2023-06-07 | Discharge: 2023-06-08 | Disposition: A | Discharge status: Home / Self Care | Payer: 59 | Attending: Certified Nurse Midwife | Admitting: Certified Nurse Midwife

## 2023-06-07 ENCOUNTER — Other Ambulatory Visit: Payer: Self-pay

## 2023-06-07 ENCOUNTER — Encounter: Payer: Self-pay | Admitting: Obstetrics and Gynecology

## 2023-06-07 DIAGNOSIS — O4693 Antepartum hemorrhage, unspecified, third trimester: Secondary | ICD-10-CM | POA: Diagnosis present

## 2023-06-07 DIAGNOSIS — O26893 Other specified pregnancy related conditions, third trimester: Principal | ICD-10-CM

## 2023-06-07 DIAGNOSIS — Z6791 Unspecified blood type, Rh negative: Principal | ICD-10-CM

## 2023-06-07 DIAGNOSIS — Z3A37 37 weeks gestation of pregnancy: Secondary | ICD-10-CM | POA: Insufficient documentation

## 2023-06-07 DIAGNOSIS — Z2839 Other underimmunization status: Secondary | ICD-10-CM

## 2023-06-07 LAB — URINALYSIS, ROUTINE W REFLEX MICROSCOPIC
Bilirubin Urine: NEGATIVE
Glucose, UA: NEGATIVE mg/dL
Ketones, ur: 80 mg/dL — AB
Nitrite: NEGATIVE
Protein, ur: NEGATIVE mg/dL
Specific Gravity, Urine: 1.015 (ref 1.005–1.030)
pH: 7 (ref 5.0–8.0)

## 2023-06-07 LAB — URINALYSIS, MICROSCOPIC (REFLEX): RBC / HPF: >50 RBC/hpf (ref 0–5)

## 2023-06-07 MED ORDER — ACETAMINOPHEN 500 MG PO TABS
1000.0000 mg | ORAL_TABLET | Freq: Once | ORAL | Status: DC
Start: 2023-06-07 — End: 2023-06-08

## 2023-06-07 MED ORDER — LACTATED RINGERS IV BOLUS
1000.0000 mL | Freq: Once | INTRAVENOUS | Status: AC
  Administered 2023-06-07: 1000 mL via INTRAVENOUS

## 2023-06-07 MED ORDER — TERBUTALINE SULFATE 1 MG/ML IJ SOLN
INTRAMUSCULAR | Status: AC
  Administered 2023-06-07: 0.25 mg via SUBCUTANEOUS
  Filled 2023-06-07: qty 1

## 2023-06-07 MED ORDER — TERBUTALINE SULFATE 1 MG/ML IJ SOLN: 0.2500 mg | Freq: Once | INTRAMUSCULAR | Status: AC

## 2023-06-07 MED ORDER — LACTATED RINGERS IV SOLN: INTRAVENOUS | Status: DC

## 2023-06-07 NOTE — OB Triage Note (Addendum)
Taylor Molina 17 y.o. G1P0 @ [redacted]w[redacted]d  presents to Labor & Delivery triage via wheelchair steered by ED staff reporting vaginal bleeding starting around 1900. She denies signs and symptoms consistent with rupture of membranes.She denies contractions and states positive fetal movement. External FM and TOCO applied to non-tender abdomen. Initial FHR 145. Ctx every 1-3 mins but patient does not feel them. She did report noticing her belly tightening after the bleeding started. Pt reports sexual intercourse around 1850 right before bleeding started. SVE 1.5 50% -1/0. Vital signs obtained and within normal limits. Patient oriented to care environment including call bell and bed control use. Janee Morn, CNM notified of patient's arrival. Plan to IV hydrate, send UA and recheck in 2 hours.   [name] B. [name], RN BSN 06/07/2023 8:17 PM

## 2023-06-08 ENCOUNTER — Telehealth: Payer: Self-pay

## 2023-06-08 ENCOUNTER — Ambulatory Visit: Payer: Self-pay

## 2023-06-08 DIAGNOSIS — O4693 Antepartum hemorrhage, unspecified, third trimester: Principal | ICD-10-CM

## 2023-06-08 DIAGNOSIS — Z3A37 37 weeks gestation of pregnancy: Secondary | ICD-10-CM | POA: Diagnosis not present

## 2023-06-08 MED ORDER — ZOLPIDEM TARTRATE 5 MG PO TABS: 5.0000 mg | ORAL_TABLET | Freq: Every evening | ORAL | Status: DC | PRN

## 2023-06-08 NOTE — Telephone Encounter (Signed)
TC to patient to reschedule missed MH RV. Unable to LM. TC to emergency contact, unable to LM.Marland KitchenBurt Knack, RN

## 2023-06-08 NOTE — OB Triage Note (Signed)
  L&D OB Triage Note  SUBJECTIVE Taylor Molina is a 17 y.o. G1P0000 female at [redacted]w[redacted]d, EDD Estimated Date of Delivery: 06/29/23 who presented to triage with complaints of vaginal bleeding. She denies contractions , loss of fluid , and is feeling good movement. She admits to having recent intercourse.    OB History  Gravida Para Term Preterm AB Living  1 0 0 0 0 0  SAB IAB Ectopic Multiple Live Births  0 0 0 0 0    # Outcome Date GA Lbr Len/2nd Weight Sex Type Anes PTL Lv  1 Current             Obstetric Comments  Living with FOB, support in labor. Living with FOB and her brothers and nephew.    Medications Prior to Admission  Medication Sig Dispense Refill Last Dose   aspirin EC 81 MG tablet Take 1 tablet (81 mg total) by mouth daily. Swallow whole.   06/06/2023   Prenatal Vit-Fe Fumarate-FA (MULTIVITAMIN-PRENATAL) 27-0.8 MG TABS tablet Take 1 tablet by mouth daily at 12 noon.   06/06/2023     OBJECTIVE  Nursing Evaluation:   BP 114/63 (BP Location: Right Arm)   Pulse (!) 108   Ht 5\' 8"  (1.727 m)   Wt 57.2 kg   LMP 09/01/2022 (Exact Date)   SpO2 100%   BMI 19.16 kg/m    Findings:        Pt contractions, possible early labor       NST was performed and has been reviewed by me.  NST INTERPRETATION: Category I  Mode: External Baseline Rate (A): 160 bpm (fht) Variability: Moderate Accelerations: 15 x 15 Decelerations:  (utd, broken strip)     Contraction Frequency (min): 2-3  ASSESSMENT Impression:  1.  Pregnancy:  G1P0000 at [redacted]w[redacted]d , EDD Estimated Date of Delivery: 06/29/23 2.  Reassuring fetal and maternal status 3.  Pt contracting, not feeling them, has made some cervical change, possible early labor.   PLAN 1. Current condition and above findings reviewed.  Reassuring fetal and maternal condition. Pt given option to rest until 0500 and recheck cervix. She requests to go home given she is not feeling discomfort. Reviewed labor precautions. Return for  ROM, contractions that are uncomfortable , and or decreased fetal movement. She verbalizes understanding and agreement.  2. Discharge home with standard labor precautions given to return to L&D or call the office for problems. 3. Continue routine prenatal care.    Doreene Burke, CNM

## 2023-06-08 NOTE — OB Triage Note (Signed)
Patient discharged home in stable condition by provider. Pt reports no pain. Discharge instructions given. Patient verbalized understanding, no questions or concerns. Pt discharged in personal vehicle with mother. Labor precautions given.   Beonca Gibb B. Janelie Goltz, RN BSN 06/08/2023 12:23 AM

## 2023-06-10 ENCOUNTER — Ambulatory Visit: Payer: 59 | Attending: Obstetrics

## 2023-06-10 ENCOUNTER — Other Ambulatory Visit: Payer: Self-pay

## 2023-06-10 DIAGNOSIS — O2613 Low weight gain in pregnancy, third trimester: Secondary | ICD-10-CM

## 2023-06-10 DIAGNOSIS — O2603 Excessive weight gain in pregnancy, third trimester: Secondary | ICD-10-CM | POA: Diagnosis present

## 2023-06-10 DIAGNOSIS — Z3401 Encounter for supervision of normal first pregnancy, first trimester: Secondary | ICD-10-CM

## 2023-06-10 DIAGNOSIS — Z363 Encounter for antenatal screening for malformations: Secondary | ICD-10-CM | POA: Insufficient documentation

## 2023-06-10 DIAGNOSIS — Z3A37 37 weeks gestation of pregnancy: Secondary | ICD-10-CM | POA: Diagnosis not present

## 2023-06-12 ENCOUNTER — Ambulatory Visit: Payer: Self-pay | Admitting: Advanced Practice Midwife

## 2023-06-12 VITALS — BP 114/71 | HR 100 | Temp 98.0°F | Wt 128.4 lb

## 2023-06-12 DIAGNOSIS — Z3403 Encounter for supervision of normal first pregnancy, third trimester: Secondary | ICD-10-CM

## 2023-06-12 DIAGNOSIS — O09899 Supervision of other high risk pregnancies, unspecified trimester: Secondary | ICD-10-CM

## 2023-06-12 DIAGNOSIS — Z6791 Unspecified blood type, Rh negative: Secondary | ICD-10-CM

## 2023-06-12 DIAGNOSIS — R636 Underweight: Secondary | ICD-10-CM

## 2023-06-12 DIAGNOSIS — Z3401 Encounter for supervision of normal first pregnancy, first trimester: Secondary | ICD-10-CM

## 2023-06-12 DIAGNOSIS — O26893 Other specified pregnancy related conditions, third trimester: Secondary | ICD-10-CM

## 2023-06-12 DIAGNOSIS — Z2839 Other underimmunization status: Secondary | ICD-10-CM

## 2023-06-12 NOTE — Progress Notes (Signed)
MHC RV appt scheduled for 06/18/23 and Myrle Sheng gave appt reminder card to client. [name], RN

## 2023-06-12 NOTE — Progress Notes (Signed)
[facility] Health Department Maternal Health Clinic  PRENATAL VISIT NOTE  Subjective:  Taylor Molina is a 17 y.o. G1P0000 at [redacted]w[redacted]d being seen today for ongoing prenatal care.  She is currently monitored for the following issues for this low-risk pregnancy and has Encounter for supervision of normal first pregnancy in first trimester; Underweight BMI=16.7; Anxiety dx'd 2022; Rh negative status during pregnancy; Maternal varicella, non-immune; and Labor and delivery, indication for care on their problem list.  Patient reports no complaints.   .  .  Movement: Present. Denies leaking of fluid/ROM.   The following portions of the patient's history were reviewed and updated as appropriate: allergies, current medications, past family history, past medical history, past social history, past surgical history and problem list. Problem list updated.  Objective:   Vitals:   06/12/23 0827  BP: 114/71  Pulse: 100  Temp: 98 F (36.7 C)  Weight: 128 lb 6.4 oz (58.2 kg)    Fetal Status:   Fundal Height: 36 cm Movement: Present  Presentation: Vertex  General:  Alert, oriented and cooperative. Patient is in no acute distress.  Skin: Skin is warm and dry. No rash noted.   Cardiovascular: Normal heart rate noted  Respiratory: Normal respiratory effort, no problems with respiration noted  Abdomen: Soft, gravid, appropriate for gestational age.        Pelvic: Cervical exam deferred        Extremities: Normal range of motion.  Edema: None  Mental Status: Normal mood and affect. Normal behavior. Normal judgment and thought content.   Assessment and Plan:  Pregnancy: G1P0000 at [redacted]w[redacted]d  1. Underweight BMI=16.7 18 lb (8.165 kg) 2 lb wt gain in last week  2. Encounter for supervision of normal first pregnancy in first trimester Here with FOB Reviewed 06/10/23 u/s at 36 5/7 with posterior placenta, 3VC, EFW=39%, anatomy wnl, AFI wnl Has car seat and crib Left vits and ASA at someone's  house 06/07/23 and hasn't been taking since--counseled to go get them and continue taking daily Not exercising; encouraged to do so 3x/wk x 20 min GBS/GC/Chlamydia neg 06/01/23   Preterm labor symptoms and general obstetric precautions including but not limited to vaginal bleeding, contractions, leaking of fluid and fetal movement were reviewed in detail with the patient. Please refer to After Visit Summary for other counseling recommendations.  Return in about 1 week (around 06/19/2023) for routine PNC.  No future appointments.  Alberteen Spindle, CNM

## 2023-06-12 NOTE — Progress Notes (Signed)
Patient here for MH RV at 37 4/7. Desires Nexplanon for BCM.Marland KitchenBurt Knack, RN

## 2023-06-12 NOTE — Telephone Encounter (Signed)
Patient in clinic today.Burt Knack, RN

## 2023-06-18 ENCOUNTER — Ambulatory Visit: Payer: Self-pay

## 2023-06-18 ENCOUNTER — Telehealth: Payer: Self-pay

## 2023-06-18 NOTE — Telephone Encounter (Signed)
Call to client with [facility] Interpreters ID [phone] to reschedule missed MHC RV appt 06/18/23. Left message to call with number provided. [name], RN

## 2023-06-19 ENCOUNTER — Ambulatory Visit: Payer: Self-pay | Admitting: Advanced Practice Midwife

## 2023-06-19 ENCOUNTER — Telehealth: Payer: Self-pay

## 2023-06-19 VITALS — BP 116/75 | HR 119 | Temp 96.8°F | Wt 128.6 lb

## 2023-06-19 DIAGNOSIS — R636 Underweight: Secondary | ICD-10-CM

## 2023-06-19 DIAGNOSIS — F419 Anxiety disorder, unspecified: Secondary | ICD-10-CM

## 2023-06-19 DIAGNOSIS — Z3403 Encounter for supervision of normal first pregnancy, third trimester: Secondary | ICD-10-CM

## 2023-06-19 DIAGNOSIS — Z3401 Encounter for supervision of normal first pregnancy, first trimester: Secondary | ICD-10-CM

## 2023-06-19 NOTE — Telephone Encounter (Signed)
TC to patient to change her appointment from 07/03/23 to 06/26/23 per provider request. Patient now scheduled for 06/26/23.Marland KitchenBurt Knack, RN

## 2023-06-19 NOTE — Progress Notes (Signed)
Patient here for MH RV at 38 4/7. .Katie Brewer-Jensen, RN  

## 2023-06-19 NOTE — Progress Notes (Signed)
[facility] Health Department Maternal Health Clinic  PRENATAL VISIT NOTE  Subjective:  Taylor Molina is a 17 y.o. G1P0000 at [redacted]w[redacted]d being seen today for ongoing prenatal care.  She is currently monitored for the following issues for this low-risk pregnancy and has Encounter for supervision of normal first pregnancy in first trimester; Underweight BMI=16.7; Anxiety dx'd 2022; Rh negative status during pregnancy; Maternal varicella, non-immune; and Labor and delivery, indication for care on their problem list.  Patient reports no complaints.  Contractions: Not present.  .  Movement: Present. Denies leaking of fluid/ROM.   The following portions of the patient's history were reviewed and updated as appropriate: allergies, current medications, past family history, past medical history, past social history, past surgical history and problem list. Problem list updated.  Objective:   Vitals:   06/19/23 1022  BP: 116/75  Pulse: (!) 119  Temp: (!) 96.8 F (36 C)  Weight: 128 lb 9.6 oz (58.3 kg)    Fetal Status:   Fundal Height: 37 cm Movement: Present     General:  Alert, oriented and cooperative. Patient is in no acute distress.  Skin: Skin is warm and dry. No rash noted.   Cardiovascular: Normal heart rate noted  Respiratory: Normal respiratory effort, no problems with respiration noted  Abdomen: Soft, gravid, appropriate for gestational age.  Pain/Pressure: Absent     Pelvic: Cervical exam deferred        Extremities: Normal range of motion.     Mental Status: Normal mood and affect. Normal behavior. Normal judgment and thought content.   Assessment and Plan:  Pregnancy: G1P0000 at [redacted]w[redacted]d  1. Underweight BMI=16.7 (Primary) 18 lb 3.2 oz (8.255 kg) 0 wt gain in last week  2. Encounter for supervision of normal first pregnancy in first trimester Taking ASA 81 mg daily Has car seat and crib Knows when to go to L&D IOL paperwork completed; pt desires IOL  07/07/23 Walking stairs/ball/squats 2x/wk x 15 min  3. Anxiety dx'd 2022 Declines need for counseling   Term labor symptoms and general obstetric precautions including but not limited to vaginal bleeding, contractions, leaking of fluid and fetal movement were reviewed in detail with the patient. Please refer to After Visit Summary for other counseling recommendations.  Return in about 1 week (around 06/26/2023) for routine PNC.  Future Appointments  Date Time Provider Department Center  07/03/2023  9:00 AM [facility] PROVIDER AC-MAT None    Alberteen Spindle, CNM

## 2023-06-24 ENCOUNTER — Encounter: Payer: Self-pay | Admitting: Obstetrics and Gynecology

## 2023-06-24 ENCOUNTER — Other Ambulatory Visit: Payer: Self-pay

## 2023-06-24 ENCOUNTER — Observation Stay
Admission: EM | Admit: 2023-06-24 | Discharge: 2023-06-24 | Disposition: A | Discharge status: Home / Self Care | Payer: 59

## 2023-06-24 DIAGNOSIS — Z6791 Unspecified blood type, Rh negative: Principal | ICD-10-CM

## 2023-06-24 DIAGNOSIS — R109 Unspecified abdominal pain: Secondary | ICD-10-CM

## 2023-06-24 DIAGNOSIS — Z3A39 39 weeks gestation of pregnancy: Secondary | ICD-10-CM | POA: Diagnosis not present

## 2023-06-24 DIAGNOSIS — O471 False labor at or after 37 completed weeks of gestation: Principal | ICD-10-CM | POA: Insufficient documentation

## 2023-06-24 DIAGNOSIS — O09899 Supervision of other high risk pregnancies, unspecified trimester: Secondary | ICD-10-CM

## 2023-06-24 DIAGNOSIS — O26893 Other specified pregnancy related conditions, third trimester: Secondary | ICD-10-CM | POA: Diagnosis not present

## 2023-06-24 LAB — URINALYSIS, ROUTINE W REFLEX MICROSCOPIC
Bilirubin Urine: NEGATIVE
Glucose, UA: NEGATIVE mg/dL
Hgb urine dipstick: NEGATIVE
Ketones, ur: NEGATIVE mg/dL
Leukocytes,Ua: NEGATIVE
Nitrite: NEGATIVE
Protein, ur: NEGATIVE mg/dL
Specific Gravity, Urine: 1.011 (ref 1.005–1.030)
pH: 7 (ref 5.0–8.0)

## 2023-06-24 LAB — WET PREP, GENITAL
Clue Cells Wet Prep HPF POC: NONE SEEN
Sperm: NONE SEEN
Trich, Wet Prep: NONE SEEN
WBC, Wet Prep HPF POC: >=10 — AB (ref <10)
Yeast Wet Prep HPF POC: NONE SEEN

## 2023-06-24 LAB — RUPTURE OF MEMBRANE (ROM)PLUS: Rom Plus: NEGATIVE

## 2023-06-24 NOTE — OB Triage Note (Signed)
Patient is a  17 yo, G1 P0, at 39 weeks 2 days. Patient presents with complaints of previous LOF and  groin pain rated a 6/10 4 days ago. Pt was not currently in any pain or actively LOF at the time of triage   Patient denies any vaginal bleeding. Patient reports + FM. Monitors applied and assessing. VSS. Initial fetal heart tone 145bpm. Free, CNM notified of patients arrival to unit. Plan to complete ROM+

## 2023-06-24 NOTE — OB Triage Note (Signed)
LABOR & DELIVERY OB TRIAGE NOTE  SUBJECTIVE  HPI Taylor Molina is a 17 y.o. G1P0000 at [redacted]w[redacted]d who presents to Labor & Delivery for evaluation of contractions and possible leaking fluid.  Interpreter services were used to obtain patient history.  She states that she has been noticing "tightening of her belly" and increased pressure for the last few days. She says the tightening is happening about every 5 minutes. She also feels like her underwear is damp when she has been sitting or laying down for a while. She says that when she was seen in triage 2 weeks ago, she was 4 cm dilated. She denies vaginal bleeding and endorses normal fetal movement.   OB History     Gravida  1   Para  0   Term  0   Preterm  0   AB  0   Living  0      SAB  0   IAB  0   Ectopic  0   Multiple  0   Live Births  0        Obstetric Comments  Living with FOB, support in labor. Living with FOB and her brothers and nephew.         OBJECTIVE  BP 108/71   Pulse 96   Temp 98.2 F (36.8 C) (Oral)   LMP 09/01/2022 (Exact Date)   General: AOx4 Heart: normal work of breathing Lungs: clear to auscultation Abdomen: gravid, soft, non-tender, mild contractions palpated Cervical exam: Dilation: 3 Effacement (%): 80 Exam by:: Kanen Mottola, CNM   NST I reviewed the NST and it was reactive.  Baseline: 145 bpm Variability: moderate Accelerations: >2 15x15 bpm Decelerations:none noted, broken tracing at times Toco: contractions q 3-4 min Category I  ASSESSMENT Impression  1) Pregnancy at G1P0000, [redacted]w[redacted]d, Estimated Date of Delivery: 06/29/23 2) Reassuring maternal/fetal status 3) Possibly early labor, not in active labor. 4) ROM+ negative, wet prep negative, GC/CT and UA pending  PLAN 1) Reviewed labor precautions, when to return to hospital.  2) Encouraged to continue with prenatal care. Appointment on 12/27. 3) Discharged in stable condition.   Lindalou Hose Inocente Krach, CNM  06/24/23   11:16 PM

## 2023-06-25 LAB — CHLAMYDIA/NGC RT PCR (ARMC ONLY)
Chlamydia Tr: NOT DETECTED
N gonorrhoeae: NOT DETECTED

## 2023-06-26 ENCOUNTER — Ambulatory Visit: Payer: Self-pay | Admitting: Advanced Practice Midwife

## 2023-06-26 ENCOUNTER — Encounter: Payer: Self-pay | Admitting: Advanced Practice Midwife

## 2023-06-26 ENCOUNTER — Encounter: Payer: Self-pay | Admitting: Obstetrics and Gynecology

## 2023-06-26 VITALS — BP 117/76 | HR 112 | Temp 97.8°F | Wt 128.0 lb

## 2023-06-26 DIAGNOSIS — R636 Underweight: Secondary | ICD-10-CM

## 2023-06-26 DIAGNOSIS — Z3401 Encounter for supervision of normal first pregnancy, first trimester: Secondary | ICD-10-CM

## 2023-06-26 DIAGNOSIS — F419 Anxiety disorder, unspecified: Secondary | ICD-10-CM

## 2023-06-26 DIAGNOSIS — Z3403 Encounter for supervision of normal first pregnancy, third trimester: Secondary | ICD-10-CM

## 2023-06-26 LAB — WET PREP FOR TRICH, YEAST, CLUE
Trichomonas Exam: NEGATIVE
Yeast Exam: NEGATIVE

## 2023-06-26 NOTE — Progress Notes (Signed)
[facility] Spanish Interpreter utilized Roddie Mc). IOL date 07/07/2023 @ 0800. Patient notified during visit.  BThiele RN

## 2023-06-26 NOTE — Progress Notes (Cosign Needed Addendum)
[facility]  PRENATAL VISIT NOTE  Subjective:  Taylor Molina is a 17 y.o. G1P0000 at [redacted]w[redacted]d being seen today for ongoing prenatal care.  She is currently monitored for the following issues for this low-risk pregnancy and has Encounter for supervision of normal first pregnancy in first trimester; Underweight BMI=16.7; Anxiety dx'd 2022; Rh negative status during pregnancy; and Maternal varicella, non-immune on their problem list.  Patient reports backache and contractions since 5 or 6 days .  Contractions: Regular. Vag. Bleeding: None.  Movement: Present. Denies ROM but indicates mild leakage of fluid with position changes.   The following portions of the patient's history were reviewed and updated as appropriate: allergies, current medications, past family history, past medical history, past social history, past surgical history and problem list. Problem list updated.  Objective:   Vitals:   06/26/23 1308  BP: 117/76  Pulse: (!) 112  Temp: 97.8 F (36.6 C)  Weight: 128 lb (58.1 kg)    Fetal Status: Fetal Heart Rate (bpm): 156 Fundal Height: 39 cm Movement: Present  Presentation: Vertex  General:  Alert, oriented and cooperative. Patient is in no acute distress.  Skin: Skin is warm and dry. No rash noted.   Cardiovascular: Normal heart rate noted  Respiratory: Normal respiratory effort, no problems with respiration noted  Abdomen: Soft, gravid, appropriate for gestational age.  Pain/Pressure: Present     Pelvic: Cervical exam deferred   Extremities: Normal range of motion.  Edema: None  Mental Status: Normal mood and affect. Normal behavior. Normal judgment and thought content.   Assessment and Plan:  Pregnancy: G1P0000 at [redacted]w[redacted]d  1. Encounter for supervision of normal first pregnancy in first trimester (Primary) ~Reports taking PNV and ASA daily.  ~Reviewed when to go to the hospital (contractions, bleeding, LOF, decreased  fetal movement). ~IOL appt scheduled for 07/07/23, 8am, at [facility]. ~Desires to breastfeed. ~Considering Nexplanon or IUD for PP contraception method.  ~Has crib and car seat. ~Has 2 sisters with her at appointment for support who are taking off of work for several weeks to help when baby arrives.  ~Patient has been to L&D with concern of SROM. Pt continues to report minimal leakage of fluid with positional changes. Sterile speculum exam performed. Negative pooling, negative nitrizine, negative ferning. Thick light brown tinted vaginal discharge present and adherent to vaginal walls. Wet Prep performed and negative.   2. Underweight BMI=16.7 Total Weight Gain: 17 lb 9.6 oz (7.983 kg) Kept MNT appointment 03/12/23  3. Anxiety dx'd 2022 ~Reports feeling well today and excited with a little nervousness for baby coming soon. Declines counseling.    Term labor symptoms and general obstetric precautions including but not limited to vaginal bleeding, contractions, leaking of fluid and fetal movement were reviewed in detail with the patient. Please refer to After Visit Summary for other counseling recommendations.  Return in about 1 week (around 07/03/2023) for routine prenatal care.  Future Appointments  Date Time Provider Department Center  07/03/2023  2:20 PM [facility] PROVIDER [facility] None   Due to language barrier, an interpreter Roddie Mc [place]) was present during the history-taking and subsequent discussion (and for part of the physical exam) with this patient.   Edmonia James, NP

## 2023-06-26 NOTE — Progress Notes (Signed)
[facility]  PRENATAL VISIT NOTE  Subjective:  Taylor Molina is a 17 y.o. G1P0000 at [redacted]w[redacted]d being seen today for ongoing prenatal care.  She is currently monitored for the following issues for this low-risk pregnancy and has Encounter for supervision of normal first pregnancy in first trimester; Underweight BMI=16.7; Anxiety dx'd 2022; Rh negative status during pregnancy; and Maternal varicella, non-immune on their problem list.  Patient reports backache and contractions since 5 or 6 days .  Contractions: Regular. Vag. Bleeding: None.  Movement: Present. Denies ROM but indicates mild leakage of fluid with position changes.   The following portions of the patient's history were reviewed and updated as appropriate: allergies, current medications, past family history, past medical history, past social history, past surgical history and problem list. Problem list updated.  Objective:   Vitals:   06/26/23 1308  BP: 117/76  Pulse: (!) 112  Temp: 97.8 F (36.6 C)  Weight: 128 lb (58.1 kg)    Fetal Status: Fetal Heart Rate (bpm): 156 Fundal Height: 39 cm Movement: Present  Presentation: Vertex  General:  Alert, oriented and cooperative. Patient is in no acute distress.  Skin: Skin is warm and dry. No rash noted.   Cardiovascular: Normal heart rate noted  Respiratory: Normal respiratory effort, no problems with respiration noted  Abdomen: Soft, gravid, appropriate for gestational age.  Pain/Pressure: Present     Pelvic: Cervical exam deferred   Extremities: Normal range of motion.  Edema: None  Mental Status: Normal mood and affect. Normal behavior. Normal judgment and thought content.   Assessment and Plan:  Pregnancy: G1P0000 at [redacted]w[redacted]d  1. Encounter for supervision of normal first pregnancy in first trimester (Primary) ~Reports taking PNV and ASA daily.  ~Reviewed when to go to the hospital (contractions, bleeding, LOF, decreased  fetal movement). ~IOL appt scheduled for 07/07/23, 8am, at [facility]. ~Desires to breastfeed. ~Considering Nexplanon or IUD for PP contraception method.  ~Has crib and car seat. ~Has 2 sisters with her at appointment for support who are taking off of work for several weeks to help when baby arrives.  ~Patient has been to L&D with concern of SROM. Pt continues to report minimal leakage of fluid with positional changes. Sterile speculum exam performed. Negative pooling, negative nitrizine, negative ferning. Thick light brown tinted vaginal discharge present and adherent to vaginal walls. Wet Prep performed and negative.   2. Underweight BMI=16.7 Total Weight Gain: 17 lb 9.6 oz (7.983 kg) Kept MNT appointment 03/12/23  3. Anxiety dx'd 2022 ~Reports feeling well today and excited with a little nervousness for baby coming soon. Declines counseling.    Term labor symptoms and general obstetric precautions including but not limited to vaginal bleeding, contractions, leaking of fluid and fetal movement were reviewed in detail with the patient. Please refer to After Visit Summary for other counseling recommendations.  Return in about 1 week (around 07/03/2023) for routine prenatal care.  Future Appointments  Date Time Provider Department Center  07/03/2023  2:20 PM [facility] PROVIDER [facility] None    Marylu Lund L. Idol, NP Attestation of Supervision of Advanced Practitioner (CNM/PA/NP): Evaluation and management procedures were performed by the Advanced Practice Provider under my supervision and collaboration.  I have reviewed the Advanced Practice Provider's note and chart, and I agree with the management and plan. I have also made any necessary editorial changes.   I was working along side this practitioner all day and all medical plans were discussed with me.  Alberteen Spindle, CNM

## 2023-06-26 NOTE — Progress Notes (Signed)
In house lab result reviewed by provider during visit. BTHIELE RN

## 2023-06-29 ENCOUNTER — Encounter: Payer: Self-pay | Admitting: Obstetrics and Gynecology

## 2023-07-01 NOTE — L&D Delivery Note (Signed)
 Obstetrical Delivery Note   Date of Delivery:   07/05/2023 Primary OB:   [facility] Gestational Age/EDD: [redacted]w[redacted]d (Dated by 9wk US ) Reason for Admission: Labor Antepartum complications: anxiety   Delivered By:   LITTIE Cookey, CNM   Delivery Type:   spontaneous vaginal delivery  Delivery Details:   Arrived in early active labor VE 5/90/0. Labor progressed spontaneously. She was found to be complete and plus one station at 0255. AROM for mec at 0304. Over the next few contractions while semi reclined she beautifully pushed to SVB of viable female at 75. Infant birthed OA to LOA, followed by tight but easy shoulders. Infant placed on mother's abdomen, vigorous cry with stimulation, HR>100. Cord doubly clamped and cut by dad once pulsation ceased. Placenta delivered with maternal effort. On inspection of perineum a 1st degree perineal laceration was found, repaired with 2-0 and 4-0 Vicryl. Lidocaine  given prior to repair. All areas hemostatic. Infant remains skin to skin with mother. Both stable.  Anesthesia:    local Intrapartum complications: None GBS:    Negative Laceration:    1st degree Episiotomy:    none Rectal exam:   deferred Placenta:    Delivered and expressed via active management. Intact: yes. To pathology: no.  Delayed Cord Clamping: yes Estimated Blood Loss:   Baby:    Liveborn female, APGARs 8/9, weight pending gm  Jinnie Cookey, CNM   [facility]  07/05/2023 4:05 AM

## 2023-07-02 ENCOUNTER — Observation Stay
Admission: EM | Admit: 2023-07-02 | Discharge: 2023-07-02 | Disposition: A | Discharge status: Home / Self Care | Payer: Self-pay | Attending: Obstetrics | Admitting: Obstetrics

## 2023-07-02 ENCOUNTER — Other Ambulatory Visit: Payer: Self-pay

## 2023-07-02 ENCOUNTER — Encounter: Payer: Self-pay | Admitting: Obstetrics and Gynecology

## 2023-07-02 DIAGNOSIS — R109 Unspecified abdominal pain: Secondary | ICD-10-CM

## 2023-07-02 DIAGNOSIS — Z2839 Other underimmunization status: Secondary | ICD-10-CM

## 2023-07-02 DIAGNOSIS — O471 False labor at or after 37 completed weeks of gestation: Principal | ICD-10-CM | POA: Insufficient documentation

## 2023-07-02 DIAGNOSIS — O4703 False labor before 37 completed weeks of gestation, third trimester: Secondary | ICD-10-CM | POA: Diagnosis present

## 2023-07-02 DIAGNOSIS — O480 Post-term pregnancy: Secondary | ICD-10-CM

## 2023-07-02 DIAGNOSIS — O26893 Other specified pregnancy related conditions, third trimester: Principal | ICD-10-CM

## 2023-07-02 DIAGNOSIS — Z3A40 40 weeks gestation of pregnancy: Secondary | ICD-10-CM | POA: Insufficient documentation

## 2023-07-02 NOTE — Final Progress Note (Signed)
 Please see Discharge Summary  Taylor Molina, [facility]  07/02/2023 6:24 AM

## 2023-07-02 NOTE — OB Triage Note (Signed)
 Pt arrives with complaints of ctx's since 2200 last evening. Pt denies fluid leaking or bleeding at this time.

## 2023-07-02 NOTE — Discharge Summary (Signed)
 Final Progress Note  Patient ID: Taylor Molina MRN: [id number] DOB/AGE: [birth year] 18 y.o.  Admit date: 07/02/2023 Admitting provider: Alm Lynwood Sar, MD Discharge date: 07/02/2023   Admission Diagnoses: uterine contractions in pregnancy  Discharge Diagnoses:  Principal Problem:   Preterm uterine contractions in third trimester, antepartum  Latent phase labor [redacted] weeks gestation  History of Present Illness: The patient is a 18 y.o. female G1P0000 at [redacted]w[redacted]d who presents for a labor check. She is non English speaking. She reports regular mild contractions since yesterday afternoon. She denies any leaking of fluid; denies any bloody show, and her baby is moving well. Her mother is with her. [facility]  Past Medical History:  Diagnosis Date   Anxiety    Medical history non-contributory    Patient denies medical problems    UTI (urinary tract infection) during pregnancy 11/17/22 >100,000 E. Coli 11/25/2022   Bactrim DS BID x 7 days per Dr. Eldonna- ordered 11/21/22- pt never picked up  Pt hasn't started as of 01/23/23--noncompliant; to begin taking on 01/23/23  [ x] TOC C&S at RV=02/20/23=neg      Past Surgical History:  Procedure Laterality Date   NO PAST SURGERIES      No current facility-administered medications on file prior to encounter.   Current Outpatient Medications on File Prior to Encounter  Medication Sig Dispense Refill   aspirin  EC 81 MG tablet Take 1 tablet (81 mg total) by mouth daily. Swallow whole.     Prenatal Vit-Fe Fumarate-FA (MULTIVITAMIN-PRENATAL) 27-0.8 MG TABS tablet Take 1 tablet by mouth daily at 12 noon.      Allergies  Allergen Reactions   Ceftriaxone Anaphylaxis    Social History   Socioeconomic History   Marital status: Single    Spouse name: Not on file   Number of children: 0   Years of education: Not on file   Highest education level: 10th grade  Occupational History   Occupation: none     Comment: in past worked at [employer]  Tobacco Use   Smoking status: Never    Passive exposure: Never   Smokeless tobacco: Never  Vaping Use   Vaping status: Never Used  Substance and Sexual Activity   Alcohol use: Not Currently    Comment: 1 year ago had alcohol but rare usage   Drug use: Never   Sexual activity: Yes    Comment: undecided  Other Topics Concern   Not on file  Social History Narrative   Not going to school currently. Plans to graduate from high school.   Social Drivers of Corporate Investment Banker Strain: Low Risk  (11/17/2022)   Overall Financial Resource Strain (CARDIA)    Difficulty of Paying Living Expenses: Not hard at all  Food Insecurity: No Food Insecurity (11/17/2022)   Hunger Vital Sign    Worried About Running Out of Food in the Last Year: Never true    Ran Out of Food in the Last Year: Never true  Transportation Needs: No Transportation Needs (11/17/2022)   PRAPARE - Administrator, Civil Service (Medical): No    Lack of Transportation (Non-Medical): No  Physical Activity: Not on file  Stress: Not on file  Social Connections: Unknown (11/17/2022)   Social Connection and Isolation Panel [NHANES]    Frequency of Communication with Friends and Family: Twice a week    Frequency of Social Gatherings with Friends and Family: Not on file  Attends Religious Services: Not on file    Active Member of Clubs or Organizations: Not on file    Attends Club or Organization Meetings: Not on file    Marital Status: Not on file  Intimate Partner Violence: Not At Risk (04/17/2023)   Humiliation, Afraid, Rape, and Kick questionnaire    Fear of Current or Ex-Partner: No    Emotionally Abused: No    Physically Abused: No    Sexually Abused: No    Family History  Problem Relation Age of Onset   Healthy Mother    Healthy Father    Healthy Sister    Healthy Brother    Heart disease Maternal Grandmother    Healthy Maternal Grandfather    Healthy  Paternal Grandmother    Healthy Paternal Grandfather      ROS   Physical Exam: BP 112/84 (BP Location: Right Arm)   Pulse (!) 105   Temp 98.1 F (36.7 C) (Oral)   Resp 18   Ht 5' 9 (1.753 m)   Wt 58.1 kg   LMP 09/01/2022 (Exact Date)   BMI 18.90 kg/m   OBGyn Exam  Consults: None  Significant Findings/ Diagnostic Studies: EFM only  Procedures: EFM NST Baseline FHR: 145 beats/min Variability: moderate Accelerations: present Decelerations: absent Tocometry: regular contractions that are very mild. The patient is smiling and shows no distress  Interpretation:  INDICATIONS: rule out uterine contractions RESULTS:  A NST procedure was performed with FHR monitoring and a normal baseline established, appropriate time of 20-40 minutes of evaluation, and accels >2 seen w 15x15 characteristics.  Results show a REACTIVE NST.    Hospital Course: The patient was admitted to Labor and Delivery Triage for observation. She was placed on the fetal monitor and a regular contraction pattern was determined. During the 3 hours she was under observation, her cervix did not change  or efface. With a Category 1 tracing, and no cervical change, she is discharged home to await active labor. A translator was used to answer questions and explain the  care this morning.  Discharge Condition: good  Disposition: Discharge disposition: 01-Home or Self Care       Diet: Regular diet  Discharge Activity: Activity as tolerated  Discharge Instructions     Fetal Kick Count:  Lie on our left side for one hour after a meal, and count the number of times your baby kicks.  If it is less than 5 times, get up, move around and drink some juice.  Repeat the test 30 minutes later.  If it is still less than 5 kicks in an hour, notify your doctor.   Complete by: As directed    LABOR:  When conractions begin, you should start to time them from the beginning of one contraction to the beginning  of the next.  When  contractions are 5 - 10 minutes apart or less and have been regular for at least an hour, you should call your health care provider.   Complete by: As directed    Notify physician for bleeding from the vagina   Complete by: As directed    Notify physician for blurring of vision or spots before the eyes   Complete by: As directed    Notify physician for chills or fever   Complete by: As directed    Notify physician for fainting spells, black outs or loss of consciousness   Complete by: As directed    Notify physician for increase in vaginal discharge  Complete by: As directed    Notify physician for leaking of fluid   Complete by: As directed    Notify physician for pain or burning when urinating   Complete by: As directed    Notify physician for pelvic pressure (sudden increase)   Complete by: As directed    Notify physician for severe or continued nausea or vomiting   Complete by: As directed    Notify physician for sudden gushing of fluid from the vagina (with or without continued leaking)   Complete by: As directed    Notify physician for sudden, constant, or occasional abdominal pain   Complete by: As directed    Notify physician if baby moving less than usual   Complete by: As directed       Allergies as of 07/02/2023       Reactions   Ceftriaxone Anaphylaxis        Medication List     TAKE these medications    aspirin  EC 81 MG tablet Take 1 tablet (81 mg total) by mouth daily. Swallow whole.   multivitamin-prenatal 27-0.8 MG Tabs tablet Take 1 tablet by mouth daily at 12 noon.         Total time spent taking care of this patient: 40 minutes  Signed: Rollene CHRISTELLA Ro, CNM  07/02/2023, 6:06 AM

## 2023-07-03 ENCOUNTER — Ambulatory Visit: Payer: Self-pay | Admitting: Advanced Practice Midwife

## 2023-07-03 ENCOUNTER — Ambulatory Visit: Payer: Self-pay

## 2023-07-03 ENCOUNTER — Encounter: Payer: Self-pay | Admitting: Advanced Practice Midwife

## 2023-07-03 VITALS — BP 115/93 | HR 76 | Temp 95.0°F | Wt 129.6 lb

## 2023-07-03 DIAGNOSIS — R636 Underweight: Secondary | ICD-10-CM

## 2023-07-03 DIAGNOSIS — Z3400 Encounter for supervision of normal first pregnancy, unspecified trimester: Secondary | ICD-10-CM

## 2023-07-03 DIAGNOSIS — Z3403 Encounter for supervision of normal first pregnancy, third trimester: Secondary | ICD-10-CM

## 2023-07-03 DIAGNOSIS — F419 Anxiety disorder, unspecified: Secondary | ICD-10-CM

## 2023-07-03 LAB — WET PREP FOR TRICH, YEAST, CLUE
Trichomonas Exam: NEGATIVE
Yeast Exam: NEGATIVE

## 2023-07-03 NOTE — Progress Notes (Signed)
 [facility] [address] [place] [address] Main phone: [phone]  Prenatal Visit  Subjective:  Taylor Molina is a 18 y.o. G1P1001 at [redacted]w[redacted]d being seen today for ongoing prenatal care.  She is currently monitored for the following issues for this low-risk pregnancy and has Encounter for supervision of normal first pregnancy in first trimester; Underweight BMI=16.7; Anxiety dx'd 2022; Rh negative status during pregnancy; Maternal varicella, non-immune; Preterm uterine contractions in third trimester, antepartum; and Uterine contractions on their problem list.  Patient reports low backache, contractions since many weeks, and increased yellow vaginal discharge since yesterday. .  Contractions: Regular. Vag. Bleeding: None.  Movement: Present. Denies leaking of fluid/ROM.   The following portions of the patient's history were reviewed and updated as appropriate: allergies, current medications, past family history, past medical history, past social history, past surgical history and problem list. Problem list updated.  Objective:   Vitals:   07/03/23 1431  BP: (!) 115/93  Pulse: 76  Temp: (!) 95 F (35 C)  Weight: 129 lb 9.6 oz (58.8 kg)    Fetal Status: Fetal Heart Rate (bpm): 142 Fundal Height: 40 cm Movement: Present  Presentation: Vertex  General:  Alert, oriented and cooperative. Patient is in no acute distress.  Skin: Skin is warm and dry. No rash noted.   Cardiovascular: Normal heart rate noted  Respiratory: Normal respiratory effort, no problems with respiration noted  Abdomen: Soft, gravid, appropriate for gestational age.  Pain/Pressure: Present (mild to lower back and abdomen with contractions)     Pelvic: Cervical exam performed, (-) pooling, (-) Nitrazine, (-) ferning, membranes intact. Cervix dilated to 4cm. Effaced to 100%. Station 0. SSE performed. Minimal spotting after cervical exam  performed.   Extremities: Normal range of motion.    Mental Status: Normal mood and affect. Normal behavior. Normal judgment and thought content.   Assessment and Plan:  Pregnancy: G1P1001 at [redacted]w[redacted]d  1. Supervision of normal first pregnancy, antepartum ~Patient reports taking ASA and PNV daily.  ~Discussed recent hospital visit from 07/02/23 (yesterday). Patient reports hospital told her she was dilated to a 4.5cm. Patient denies bleeding. States she has had increased thin, yellow discharge since yesterday but denies gushes of fluid. Hospital discharged after assessment of mother and baby. Reviewing hospital note, NST was cat 1 and reassuring. Note indicates membranes intact.  ~Patient indicates baby is moving a lot. When asked, patient indicated it was more than 10 times per day.   ~States she is having mild contractions that are regular in duration and frequency with accompanied low back pain. She indicates the contractions have been every 4-5 minutes, lasting about 30-60 seconds each, and occur throughout the day but she cannot specifically state how long of a period these occur over. Patient indicates each time she has gone to the hospital it is because she is having contractions similar to what we educated her to go to the hospital for. Because of these symptoms SVE and SSE performed in office today. Also patient requested and agreed to membrane sweeping after educated on pros and cons of membrane sweeping. Performed easily without difficulty. Please see objective portion of PE above for specifics.  ~Reviewed when to go to hospital with patient.   IOL scheduled 07/07/23. Patient aware.   2. Underweight BMI=16.7 (Primary) Total weight Gain: 19 lb 3.2 oz (8.709 kg) Pre-gravid BMII=16.30 (Underweight). Expected weight gain this pregnancy 28-40 pounds.  3. Anxiety dx'd 2022 Patient reports she  is feeling well mentally. She indicates her partner is very nervous. He is accompanying her today.  Reassurance given and opportunity to ask questions provided. Questions answered. Patient has declined counseling in the past. This was not revisited today.   Term labor symptoms and general obstetric precautions including but not limited to vaginal bleeding, contractions, leaking of fluid and fetal movement were reviewed in detail with the patient. Please refer to After Visit Summary for other counseling recommendations.   Due to language barrier, an interpreter was present during the history-taking and subsequent discussion (and for part of the physical exam) with this patient.  Next appt at [facility] will be PP.   Clarita LITTIE Narrow, NP

## 2023-07-03 NOTE — Progress Notes (Signed)
 In house provider reviewed in house lab result during visit. BTHIELE RN

## 2023-07-04 ENCOUNTER — Other Ambulatory Visit: Payer: Self-pay

## 2023-07-04 ENCOUNTER — Inpatient Hospital Stay
Admission: EM | Admit: 2023-07-04 | Discharge: 2023-07-06 | DRG: 807 | Disposition: A | Discharge status: Home / Self Care | Payer: MEDICAID | Attending: Certified Nurse Midwife | Admitting: Certified Nurse Midwife

## 2023-07-04 ENCOUNTER — Encounter: Payer: Self-pay | Admitting: Obstetrics and Gynecology

## 2023-07-04 DIAGNOSIS — F419 Anxiety disorder, unspecified: Secondary | ICD-10-CM | POA: Diagnosis present

## 2023-07-04 DIAGNOSIS — Z6791 Unspecified blood type, Rh negative: Secondary | ICD-10-CM | POA: Diagnosis not present

## 2023-07-04 DIAGNOSIS — O99344 Other mental disorders complicating childbirth: Secondary | ICD-10-CM | POA: Diagnosis present

## 2023-07-04 DIAGNOSIS — Z23 Encounter for immunization: Secondary | ICD-10-CM | POA: Diagnosis not present

## 2023-07-04 DIAGNOSIS — O26893 Other specified pregnancy related conditions, third trimester: Secondary | ICD-10-CM | POA: Diagnosis present

## 2023-07-04 DIAGNOSIS — O479 False labor, unspecified: Secondary | ICD-10-CM | POA: Diagnosis present

## 2023-07-04 DIAGNOSIS — Z3A40 40 weeks gestation of pregnancy: Secondary | ICD-10-CM

## 2023-07-04 DIAGNOSIS — O480 Post-term pregnancy: Secondary | ICD-10-CM | POA: Diagnosis not present

## 2023-07-04 DIAGNOSIS — Z8249 Family history of ischemic heart disease and other diseases of the circulatory system: Secondary | ICD-10-CM | POA: Diagnosis not present

## 2023-07-04 DIAGNOSIS — O09899 Supervision of other high risk pregnancies, unspecified trimester: Secondary | ICD-10-CM

## 2023-07-04 DIAGNOSIS — Z2839 Other underimmunization status: Secondary | ICD-10-CM

## 2023-07-04 LAB — CBC
HCT: 36 % (ref 36.0–46.0)
Hemoglobin: 11.9 g/dL — ABNORMAL LOW (ref 12.0–15.0)
MCH: 28 pg (ref 26.0–34.0)
MCHC: 33.1 g/dL (ref 30.0–36.0)
MCV: 84.7 fL (ref 80.0–100.0)
Platelets: 241 10*3/uL (ref 150–400)
RBC: 4.25 MIL/uL (ref 3.87–5.11)
RDW: 14.5 % (ref 11.5–15.5)
WBC: 10 10*3/uL (ref 4.0–10.5)
nRBC: 0 % (ref 0.0–0.2)

## 2023-07-04 MED ORDER — AMMONIA AROMATIC IN INHA
RESPIRATORY_TRACT | Status: AC
  Filled 2023-07-04: qty 10

## 2023-07-04 MED ORDER — OXYTOCIN-SODIUM CHLORIDE 30-0.9 UT/500ML-% IV SOLN
2.5000 [IU]/h | INTRAVENOUS | Status: DC
  Administered 2023-07-05: 2.5 [IU]/h via INTRAVENOUS

## 2023-07-04 MED ORDER — MISOPROSTOL 200 MCG PO TABS
ORAL_TABLET | ORAL | Status: AC
  Filled 2023-07-04: qty 4

## 2023-07-04 MED ORDER — SOD CITRATE-CITRIC ACID 500-334 MG/5ML PO SOLN
30.0000 mL | ORAL | Status: DC | PRN
Start: 2023-07-04 — End: 2023-07-05

## 2023-07-04 MED ORDER — OXYTOCIN BOLUS FROM INFUSION: 333.0000 mL | Freq: Once | INTRAVENOUS | Status: AC

## 2023-07-04 MED ORDER — OXYTOCIN-SODIUM CHLORIDE 30-0.9 UT/500ML-% IV SOLN
INTRAVENOUS | Status: AC
  Administered 2023-07-05: 333 mL via INTRAVENOUS
  Filled 2023-07-04: qty 500

## 2023-07-04 MED ORDER — LACTATED RINGERS IV SOLN
500.0000 mL | INTRAVENOUS | Status: DC | PRN
  Administered 2023-07-05: 500 mL via INTRAVENOUS

## 2023-07-04 MED ORDER — FENTANYL CITRATE (PF) 100 MCG/2ML IJ SOLN: 50.0000 ug | INTRAMUSCULAR | Status: DC | PRN

## 2023-07-04 MED ORDER — LIDOCAINE HCL (PF) 1 % IJ SOLN: 30.0000 mL | INTRAMUSCULAR | Status: AC | PRN

## 2023-07-04 MED ORDER — ONDANSETRON HCL 4 MG/2ML IJ SOLN: 4.0000 mg | Freq: Four times a day (QID) | INTRAMUSCULAR | Status: DC | PRN

## 2023-07-04 MED ORDER — OXYTOCIN 10 UNIT/ML IJ SOLN
INTRAMUSCULAR | Status: AC
  Filled 2023-07-04: qty 2

## 2023-07-04 MED ORDER — TERBUTALINE SULFATE 1 MG/ML IJ SOLN
INTRAMUSCULAR | Status: AC
  Filled 2023-07-04: qty 1

## 2023-07-04 MED ORDER — LIDOCAINE HCL (PF) 1 % IJ SOLN
INTRAMUSCULAR | Status: AC
  Administered 2023-07-05: 30 mL via SUBCUTANEOUS
  Filled 2023-07-04: qty 30

## 2023-07-04 NOTE — OB Triage Note (Signed)
 Patient is a 18 yo, G1P0, at 40 weeks 5 days. Patient presents with complaints of contractions. Patient states she had her membranes swept at the [facility] yesterday and has been contracting about every 5 minutes since. Patient reports the contractions have picked up in intensity. Patient denies any vaginal bleeding or LOF. Patient reports +FM. Monitors applied and assessing. VSS. Initial fetal heart tone 150. Dominic, CNM notified of patients arrival to unit. Plan to place in observation for fetal monitoring and a labor check.

## 2023-07-04 NOTE — H&P (Signed)
 Taylor Molina is a 18 y.o. female presenting for contractions. She has been contracting for a few days. She was sent home from triage on 1/2. Yesterday at her [facility] she had a membrane sweep. Since then, she has had more painful contractions. Yesterday she had black discharge, today it was more like mucus and small amount of blood.   Taylor Molina has had early and regular prenatal care ad [facility]. Her pregnancy has been uncomplicated. She does have a history of anxiety and was advised to see  psychiatry.    OB History     Gravida  1   Para  0   Term  0   Preterm  0   AB  0   Living  0      SAB  0   IAB  0   Ectopic  0   Multiple  0   Live Births  0        Obstetric Comments  Living with FOB, support in labor. Living with FOB and her brothers and nephew.        Past Medical History:  Diagnosis Date   Anxiety    Medical history non-contributory    Patient denies medical problems    UTI (urinary tract infection) during pregnancy 11/17/22 >100,000 E. Coli 11/25/2022   Bactrim DS BID x 7 days per Dr. Eldonna- ordered 11/21/22- pt never picked up  Pt hasn't started as of 01/23/23--noncompliant; to begin taking on 01/23/23  [ x] TOC C&S at RV=02/20/23=neg     Past Surgical History:  Procedure Laterality Date   NO PAST SURGERIES     Family History: family history includes Healthy in her brother, father, maternal grandfather, mother, paternal grandfather, paternal grandmother, and sister; Heart disease in her maternal grandmother. Social History:  reports that she has never smoked. She has never been exposed to tobacco smoke. She has never used smokeless tobacco. She reports that she does not currently use alcohol. She reports that she does not use drugs.     Maternal Diabetes: No Genetic Screening: Normal Maternal Ultrasounds/Referrals: Normal Fetal Ultrasounds or other Referrals:  None Maternal Substance Abuse:  No Significant Maternal Medications:  None Significant  Maternal Lab Results:  Group B Strep negative and Rh negative Number of Prenatal Visits:greater than 3 verified prenatal visits Maternal Vaccinations:TDap Other Comments:  None  Review of Systems History Dilation: 5 Effacement (%): 90 Station: 0 Exam by:: Janai Brannigan CNM Blood pressure 117/71, pulse 92, temperature 98.2 F (36.8 C), temperature source Oral, resp. rate 16, height 5' 9 (1.753 m), weight 58.8 kg, last menstrual period 09/01/2022. Maternal Exam:  Introitus: Normal vulva.   Physical Exam Constitutional:      Appearance: She is well-developed.  Cardiovascular:     Rate and Rhythm: Normal rate.  Pulmonary:     Effort: Pulmonary effort is normal.  Abdominal:     Tenderness: There is no abdominal tenderness.     Comments: Gravid, EFW 7lbs   Genitourinary:    General: Normal vulva.     Comments: SVE 5/90/0, vertex  Musculoskeletal:     Cervical back: Normal range of motion.     Right lower leg: No edema.     Left lower leg: No edema.  Skin:    General: Skin is warm.  Neurological:     General: No focal deficit present.     Mental Status: She is alert.  Psychiatric:        Mood and Affect: Mood normal.  EFM: baseline 145, moderate variability, pos accel, neg decel  TOOC: q 2-5   Prenatal labs: ABO, Rh: A/Negative/-- (05/20 1055) Antibody: Negative (10/04 1125) Rubella: 7.21 (05/20 1055) RPR: Non Reactive (10/04 1125)  HBsAg: Negative (05/20 1055)  HIV: Non Reactive (05/20 1055)  GBS: Negative/-- (12/02 1022)   Assessment/Plan: G1P0 at [id number] by 9wk US   in early active labor  -Admit, labs ordered -IV to HL -Intermittent auscultation  -Category I tracing -Rh negative  -GBS negative, membranes intact -Pain management: aware of all options, will asked if desired  Dr Janit aware of admission   Jinnie Cookey, [place]  [facility]  07/04/2023 8:41 PM    Abubakr Wieman M Alma Muegge 07/04/2023, 8:31 PM

## 2023-07-04 NOTE — Progress Notes (Signed)
 Taylor Molina is a 18 y.o. G1P0000 at [redacted]w[redacted]d by ultrasound admitted for labor  Subjective: Sitting on birthball, reports more painful contractions   Objective: BP 111/68   Pulse 88   Temp 98.4 F (36.9 C) (Oral)   Resp 18   Ht 5' 9 (1.753 m)   Wt 58.8 kg   LMP 09/01/2022 (Exact Date)   BMI 19.14 kg/m  No intake/output data recorded. No intake/output data recorded.  Fetal heart tone at 2300 145  UC:   q 2-5  SVE:   Dilation: 5 Effacement (%): 90 Station: 0 Exam by:: Taylor Molina, CNM  Labs: Lab Results  Component Value Date   WBC 10.0 07/04/2023   HGB 11.9 (Taylor) 07/04/2023   HCT 36.0 07/04/2023   MCV 84.7 07/04/2023   PLT 241 07/04/2023    Assessment / Plan: Spontaneous  labor,  latent labor   Labor: No change in cervical exam. Offered Augmentation with Pitocin  or AROM, pt declines, she would prefer to wait. Will reassess in 2 hours then consider augmentation.   Fetal Wellbeing:   reassuring heart tones  Pain Control:  Labor support without medications I/D:   GSB negative, membranes intact  Anticipated MOD:  NSVD  Spanish interpretor present on tablet to discuss plan.  Taylor Molina Taylor Molina, CNM 07/04/2023, 11:47 PM

## 2023-07-05 ENCOUNTER — Encounter: Payer: Self-pay | Admitting: Obstetrics and Gynecology

## 2023-07-05 LAB — TYPE AND SCREEN
ABO/RH(D): A NEG
Antibody Screen: POSITIVE

## 2023-07-05 LAB — RPR: RPR Ser Ql: NONREACTIVE

## 2023-07-05 MED ORDER — ZOLPIDEM TARTRATE 5 MG PO TABS
5.0000 mg | ORAL_TABLET | Freq: Every evening | ORAL | Status: DC | PRN
Start: 2023-07-05 — End: 2023-07-06

## 2023-07-05 MED ORDER — DIPHENHYDRAMINE HCL 25 MG PO CAPS: 25.0000 mg | ORAL_CAPSULE | Freq: Four times a day (QID) | ORAL | Status: DC | PRN

## 2023-07-05 MED ORDER — ACETAMINOPHEN 500 MG PO TABS
1000.0000 mg | ORAL_TABLET | Freq: Four times a day (QID) | ORAL | Status: DC
  Administered 2023-07-05 (×2): 1000 mg via ORAL
  Filled 2023-07-05 (×2): qty 2

## 2023-07-05 MED ORDER — BENZOCAINE-MENTHOL 20-0.5 % EX AERO
1.0000 | INHALATION_SPRAY | CUTANEOUS | Status: DC | PRN
  Administered 2023-07-05: 1 via TOPICAL
  Filled 2023-07-05: qty 56

## 2023-07-05 MED ORDER — COCONUT OIL OIL: 1.0000 | TOPICAL_OIL | Status: DC | PRN

## 2023-07-05 MED ORDER — OXYTOCIN-SODIUM CHLORIDE 30-0.9 UT/500ML-% IV SOLN: 1.0000 m[IU]/min | INTRAVENOUS | Status: DC

## 2023-07-05 MED ORDER — PRENATAL MULTIVITAMIN CH
1.0000 | ORAL_TABLET | Freq: Every day | ORAL | Status: DC
Start: 2023-07-05 — End: 2023-07-06
  Administered 2023-07-05 – 2023-07-06 (×2): 1 via ORAL
  Filled 2023-07-05 (×2): qty 1

## 2023-07-05 MED ORDER — DIBUCAINE (PERIANAL) 1 % EX OINT: 1.0000 | TOPICAL_OINTMENT | CUTANEOUS | Status: DC | PRN

## 2023-07-05 MED ORDER — ACETAMINOPHEN 500 MG PO TABS: 1000.0000 mg | ORAL_TABLET | Freq: Four times a day (QID) | ORAL | Status: DC

## 2023-07-05 MED ORDER — SIMETHICONE 80 MG PO CHEW: 80.0000 mg | CHEWABLE_TABLET | ORAL | Status: DC | PRN

## 2023-07-05 MED ORDER — TERBUTALINE SULFATE 1 MG/ML IJ SOLN: 0.2500 mg | Freq: Once | INTRAMUSCULAR | Status: DC | PRN

## 2023-07-05 MED ORDER — ONDANSETRON HCL 4 MG/2ML IJ SOLN: 4.0000 mg | INTRAMUSCULAR | Status: DC | PRN

## 2023-07-05 MED ORDER — DOCUSATE SODIUM 100 MG PO CAPS
100.0000 mg | ORAL_CAPSULE | Freq: Two times a day (BID) | ORAL | Status: DC
Start: 2023-07-05 — End: 2023-07-06
  Administered 2023-07-05 – 2023-07-06 (×2): 100 mg via ORAL
  Filled 2023-07-05 (×2): qty 1

## 2023-07-05 MED ORDER — IBUPROFEN 600 MG PO TABS
600.0000 mg | ORAL_TABLET | Freq: Four times a day (QID) | ORAL | Status: DC
  Administered 2023-07-05 (×2): 600 mg via ORAL
  Filled 2023-07-05 (×2): qty 1

## 2023-07-05 MED ORDER — ONDANSETRON HCL 4 MG PO TABS: 4.0000 mg | ORAL_TABLET | ORAL | Status: DC | PRN

## 2023-07-05 MED ORDER — IBUPROFEN 600 MG PO TABS
600.0000 mg | ORAL_TABLET | Freq: Four times a day (QID) | ORAL | Status: DC
  Administered 2023-07-05 – 2023-07-06 (×3): 600 mg via ORAL
  Filled 2023-07-05 (×3): qty 1

## 2023-07-05 MED ORDER — WITCH HAZEL-GLYCERIN EX PADS
1.0000 | MEDICATED_PAD | CUTANEOUS | Status: DC | PRN
  Administered 2023-07-05: 1 via TOPICAL
  Filled 2023-07-05: qty 100

## 2023-07-05 NOTE — Lactation Note (Signed)
 This note was copied from a baby's chart. Lactation Consultation Note  Patient Name: Taylor Molina Unijb'd Date: 07/05/2023 Age:20 hours Reason for consult: Initial assessment;Primapara;Term   Maternal Data Has patient been taught Hand Expression?: Yes Does the patient have breastfeeding experience prior to this delivery?: No  Initial assessment w/ a P1 patient and a 8hr old baby Taylor Khloe.  This was a SVD. Feeding goal is breastfeeding.  Patient does not have a pump at home but would like a hand pump.    Feeding Mother's Current Feeding Choice: Breast Milk  Infant cueing while LC present in room.  Patients mother assisted patient w/ placing infant to the breast.  Infant latched immediately.  Wide open gape, actively sucking at the breast and great positioning w/ assist from patient's mother.  LATCH Score Latch: Grasps breast easily, tongue down, lips flanged, rhythmical sucking.  Audible Swallowing: Spontaneous and intermittent  Type of Nipple: Everted at rest and after stimulation  Comfort (Breast/Nipple): Soft / non-tender  Hold (Positioning): No assistance needed to correctly position infant at breast.  LATCH Score: 10   Lactation Tools Discussed/Used Tools: Pump Breast pump type: Manual Reason for Pumping: Patient is not pumping but didnt have a breast pump.  LC provided patient w/ a Medela Manual pump. Patient's mother stated she knew how to help patient w/ the manual pump.   Interventions Interventions: Breast feeding basics reviewed;Breast compression;Hand pump;Education  LC provided education on the following;  milk production expectations, hunger cues, day 1/2 wet/dirty diapers, hand expression, cluster feeding, benefits of STS and arousing infant for a feeding.  Lactation informed patient of feeding infant at least 8 or more times w/in a 24hr period but not exceeding 3hrs. Patient verbalized understanding.    Discharge Pump: Manual (LC provided  patient w/ a hand pump.) WIC Program: Yes  Consult Status Consult Status: Follow-up Date: 07/06/23 Follow-up type: In-patient    Jivan Symanski S Joeanna Howdyshell 07/05/2023, 12:04 PM

## 2023-07-05 NOTE — Progress Notes (Signed)
 Post Partum Day 0 Subjective: Taylor Molina is feeling well overall. She has not yet gotten up or had breakfast. Her pain is well-controlled and her bleeding is WNL. Her mood is stable. Breastfeeding is going well.   Objective: Blood pressure 115/67, pulse (!) 112, temperature 98.7 F (37.1 C), temperature source Oral, resp. rate 16, height 5' 9 (1.753 m), weight 58.8 kg, last menstrual period 09/01/2022, SpO2 99%, unknown if currently breastfeeding.  Physical Exam:  General: alert, cooperative, and appears stated age Lochia: appropriate Uterine Fundus: firm Perineum: healing well DVT Evaluation: No evidence of DVT seen on physical exam.  Recent Labs    07/04/23 2002  HGB 11.9*  HCT 36.0    Assessment/Plan: Plan for discharge tomorrow Lactation assistance as needed Routine PP care   LOS: 1 day   Eleanor CHRISTELLA Canny, CNM 07/05/2023, 10:42 AM

## 2023-07-05 NOTE — Discharge Summary (Signed)
 Postpartum Discharge Summary  Date of Service updated 07/06/2023     Patient Name: Taylor Molina [facility] DOB: 13-May-2006 MRN: [id number]  Date of admission: 07/04/2023 Delivery date:07/05/2023 Delivering provider: DELINDA MELNICK MARIE Date of discharge: 07/06/2023  Admitting diagnosis: Uterine contractions [O47.9] Intrauterine pregnancy: [redacted]w[redacted]d     Secondary diagnosis:  Principal Problem:   Uterine contractions  Additional problems: Anxiety     Discharge diagnosis: Term Pregnancy Delivered                                              Post partum procedures:rhogam Augmentation: AROM Complications: None  Hospital course: Onset of Labor With Vaginal Delivery      18 y.o. yo G1P1001 at [redacted]w[redacted]d was admitted in Active Labor on 07/04/2023. Labor course was complicated byNA  Membrane Rupture Time/Date: 3:04 AM,07/05/2023  Delivery Method:Vaginal, Spontaneous Operative Delivery:N/A Episiotomy: None Lacerations:  1st degree;Perineal Patient had a postpartum course uncomplicated.  She is ambulating, tolerating a regular diet, passing flatus, and urinating well. Patient is discharged home in stable condition on 07/06/23.  Newborn Data: Birth date:07/05/2023 Birth time:3:17 AM Gender:Female Living status:Living Apgars:8 ,9  Weight:3880 g  Magnesium Sulfate received: No BMZ received: No Rhophylac :Yes MMR:N/A T-DaP:Given prenatally Flu: No RSV Vaccine received: No Transfusion:No Immunizations administered: Immunization History  Administered Date(s) Administered   Tdap 04/17/2023    Physical exam  Vitals:   07/05/23 1538 07/05/23 2000 07/05/23 2305 07/06/23 0822  BP: 96/60 103/71 100/60 101/68  Pulse: 87 89 100 90  Resp: 16 18 18 20   Temp: 97.6 F (36.4 C) 98.6 F (37 C) 98.5 F (36.9 C) 98.5 F (36.9 C)  TempSrc: Oral Oral  Oral  SpO2: 98% 100% 100% 99%  Weight:      Height:       General: alert, cooperative, and no distress Lochia: appropriate Uterine Fundus:  firm Incision: N/A DVT Evaluation: No evidence of DVT seen on physical exam. Negative Homan's sign. No cords or calf tenderness. Labs: Lab Results  Component Value Date   WBC 9.4 07/06/2023   HGB 10.2 (L) 07/06/2023   HCT 30.6 (L) 07/06/2023   MCV 84.3 07/06/2023   PLT 237 07/06/2023      Latest Ref Rng & Units 04/23/2022    5:29 PM  CMP  Glucose 70 - 99 mg/dL 899   BUN 4 - 18 mg/dL 7   Creatinine 9.49 - 8.99 mg/dL 9.45   Sodium 864 - 854 mmol/L 137   Potassium 3.5 - 5.1 mmol/L 3.7   Chloride 98 - 111 mmol/L 109   CO2 22 - 32 mmol/L 23   Calcium 8.9 - 10.3 mg/dL 9.2   Total Protein 6.5 - 8.1 g/dL 7.6   Total Bilirubin 0.3 - 1.2 mg/dL 1.0   Alkaline Phos 47 - 119 U/L 50   AST 15 - 41 U/L 12   ALT 0 - 44 U/L 8    Edinburgh Score:    07/05/2023   11:05 PM  Edinburgh Postnatal Depression Scale Screening Tool  I have been able to laugh and see the funny side of things. 0  I have looked forward with enjoyment to things. 0  I have blamed myself unnecessarily when things went wrong. 0  I have been anxious or worried for no good reason. 0  I have felt scared or panicky for no good  reason. 0  Things have been getting on top of me. 1  I have been so unhappy that I have had difficulty sleeping. 0  I have felt sad or miserable. 0  I have been so unhappy that I have been crying. 0  The thought of harming myself has occurred to me. 0  Edinburgh Postnatal Depression Scale Total 1      After visit meds:  Allergies as of 07/06/2023       Reactions   Ceftriaxone Anaphylaxis        Medication List     STOP taking these medications    aspirin  EC 81 MG tablet   multivitamin-prenatal 27-0.8 MG Tabs tablet         Discharge home in stable condition Infant Feeding: Breast Infant Disposition:home with mother Discharge instruction: per After Visit Summary and Postpartum booklet. Activity: Advance as tolerated. Pelvic rest for 6 weeks.  Diet: routine diet Anticipated  Birth Control: IUD and Nexplanon Postpartum Appointment:2 weeks Additional Postpartum F/U: Postpartum Depression checkup Future Appointments:No future appointments. Follow up Visit:  Follow-up Information     [facility] Follow up in 2 week(s).   Why: 2week mood check and 6wk PP visit Contact information: [address] [place]  [address] [phone]                    07/06/2023 Taylor Molina, CNM

## 2023-07-05 NOTE — Discharge Instructions (Addendum)
 Discharge instructions:   Call office if you have any of the following: headache, visual changes, fever >101 F, chills, breast concerns, excessive vaginal bleeding, leg pain or redness, depression or any other concerns.   Activity: Do not lift > 20 lbs for 6 weeks.  No intercourse or tampons for 6 weeks.  No driving for 1-2 weeks.   Call your doctor for increased pain or vaginal bleeding, temperature above 101.0, depression, or concerns.  No strenuous activity or heavy lifting for 6 weeks.  No intercourse, tampons, douching, or enemas for 6 weeks.  Tub baths and showers are ok.  No driving for 2 weeks or while taking pain medications.  Continue prenatal vitamin and iron.  Increase calories and fluids while breastfeeding.  For concerns about your baby please call the pediatrician For breastfeeding issues please remember to call our lactation consultants.  Instrucciones para el alta:   Llame al consultorio si tiene alguno de los siguientes sntomas: dolor de cabeza, cambios en la vista, fiebre >101 F, escalofros, problemas en los senos, sangrado vaginal excesivo, dolor o enrojecimiento de las piernas, depresin o cualquier otra inquietud.   Actividad: No levante ms de 20 libras durante 6 semanas.  No tenga relaciones sexuales ni use tampones durante 6 semanas.  No conduzca durante 1 o 2 semanas.   Llame a su mdico si siente un aumento del dolor o sangrado vaginal, temperatura superior a 101.0 F, depresin o inquietudes. No realice actividades extenuantes ni levante objetos pesados durante 6 semanas. No tenga relaciones sexuales, no use tampones, no se haga duchas vaginales ni enemas durante 6 semanas. Se permiten baos de tina y duchas. No conduzca durante 2 semanas o mientras toma analgsicos. Contine con las vitaminas y el hierro prenatales. Aumente las caloras y los lquidos durante la market researcher.   Si tiene inquietudes sobre su beb, llame al pediatra.  Si tiene inquietudes customer service manager, recuerde llamar a nuestros asesores de market researcher.

## 2023-07-06 LAB — CBC
HCT: 30.6 % — ABNORMAL LOW (ref 36.0–46.0)
Hemoglobin: 10.2 g/dL — ABNORMAL LOW (ref 12.0–15.0)
MCH: 28.1 pg (ref 26.0–34.0)
MCHC: 33.3 g/dL (ref 30.0–36.0)
MCV: 84.3 fL (ref 80.0–100.0)
Platelets: 237 10*3/uL (ref 150–400)
RBC: 3.63 MIL/uL — ABNORMAL LOW (ref 3.87–5.11)
RDW: 15.1 % (ref 11.5–15.5)
WBC: 9.4 10*3/uL (ref 4.0–10.5)
nRBC: 0 % (ref 0.0–0.2)

## 2023-07-06 LAB — ABO/RH: ABO/RH(D): A NEG

## 2023-07-06 LAB — FETAL SCREEN: Fetal Screen: NEGATIVE

## 2023-07-06 MED ORDER — RHO D IMMUNE GLOBULIN 1500 UNIT/2ML IJ SOSY
300.0000 ug | PREFILLED_SYRINGE | Freq: Once | INTRAMUSCULAR | Status: AC
  Administered 2023-07-06: 300 ug via INTRAVENOUS
  Filled 2023-07-06: qty 2

## 2023-07-06 MED ORDER — VARICELLA VIRUS VACCINE LIVE 1350 PFU/0.5ML IJ SUSR
0.5000 mL | Freq: Once | INTRAMUSCULAR | Status: DC
  Filled 2023-07-06: qty 0.5

## 2023-07-06 NOTE — Lactation Note (Signed)
 This note was copied from a baby's chart. Lactation Consultation Note  Patient Name: Girl Doreatha Offer Unijb'd Date: 07/06/2023 Age:20 hours Reason for consult: Follow-up assessment   Maternal Data Follow up assessment with 31hr old baby girl. Patient stated that feeding has been going well.     Feeding  Infant was ready for a feed upon entry into the room. Patients mother placed infant onto breast. Infant latched immediately with no need for interventions. Great positioning and actively sucking at breast.   LATCH Score Latch: Grasps breast easily, tongue down, lips flanged, rhythmical sucking.  Audible Swallowing: Spontaneous and intermittent  Type of Nipple: Everted at rest and after stimulation  Comfort (Breast/Nipple): Soft / non-tender  Hold (Positioning): No assistance needed to correctly position infant at breast.  LATCH Score: 10  Interventions Interventions: Education;CDC Guidelines for Breast Pump Cleaning  Discharge Discharge Education: Engorgement and breast care  Education on engorgement prevention/treatment was discussed as well as breastmilk storage guidelines.  LC provided patient with a handout on breastmilk storage guidelines from [facility]. [facility] outpatient lactation services phone number written on the white board in the room.  Patient verbalized understanding   Consult Status Consult Status: Complete Follow-up type: Call as needed    Jermichael Belmares S Iyannah Blake 07/06/2023, 10:21 AM

## 2023-07-07 LAB — RHOGAM INJECTION: Unit division: 0

## 2023-10-14 ENCOUNTER — Ambulatory Visit: Payer: Self-pay

## 2023-10-24 IMAGING — CR DG CHEST 2V
2 series · 2 of 2 positions shown · non-contrast
Comparison: None.

CLINICAL DATA: Shortness of breath and rapid heart rate.

EXAM:
CHEST - 2 VIEW

[chest pa]
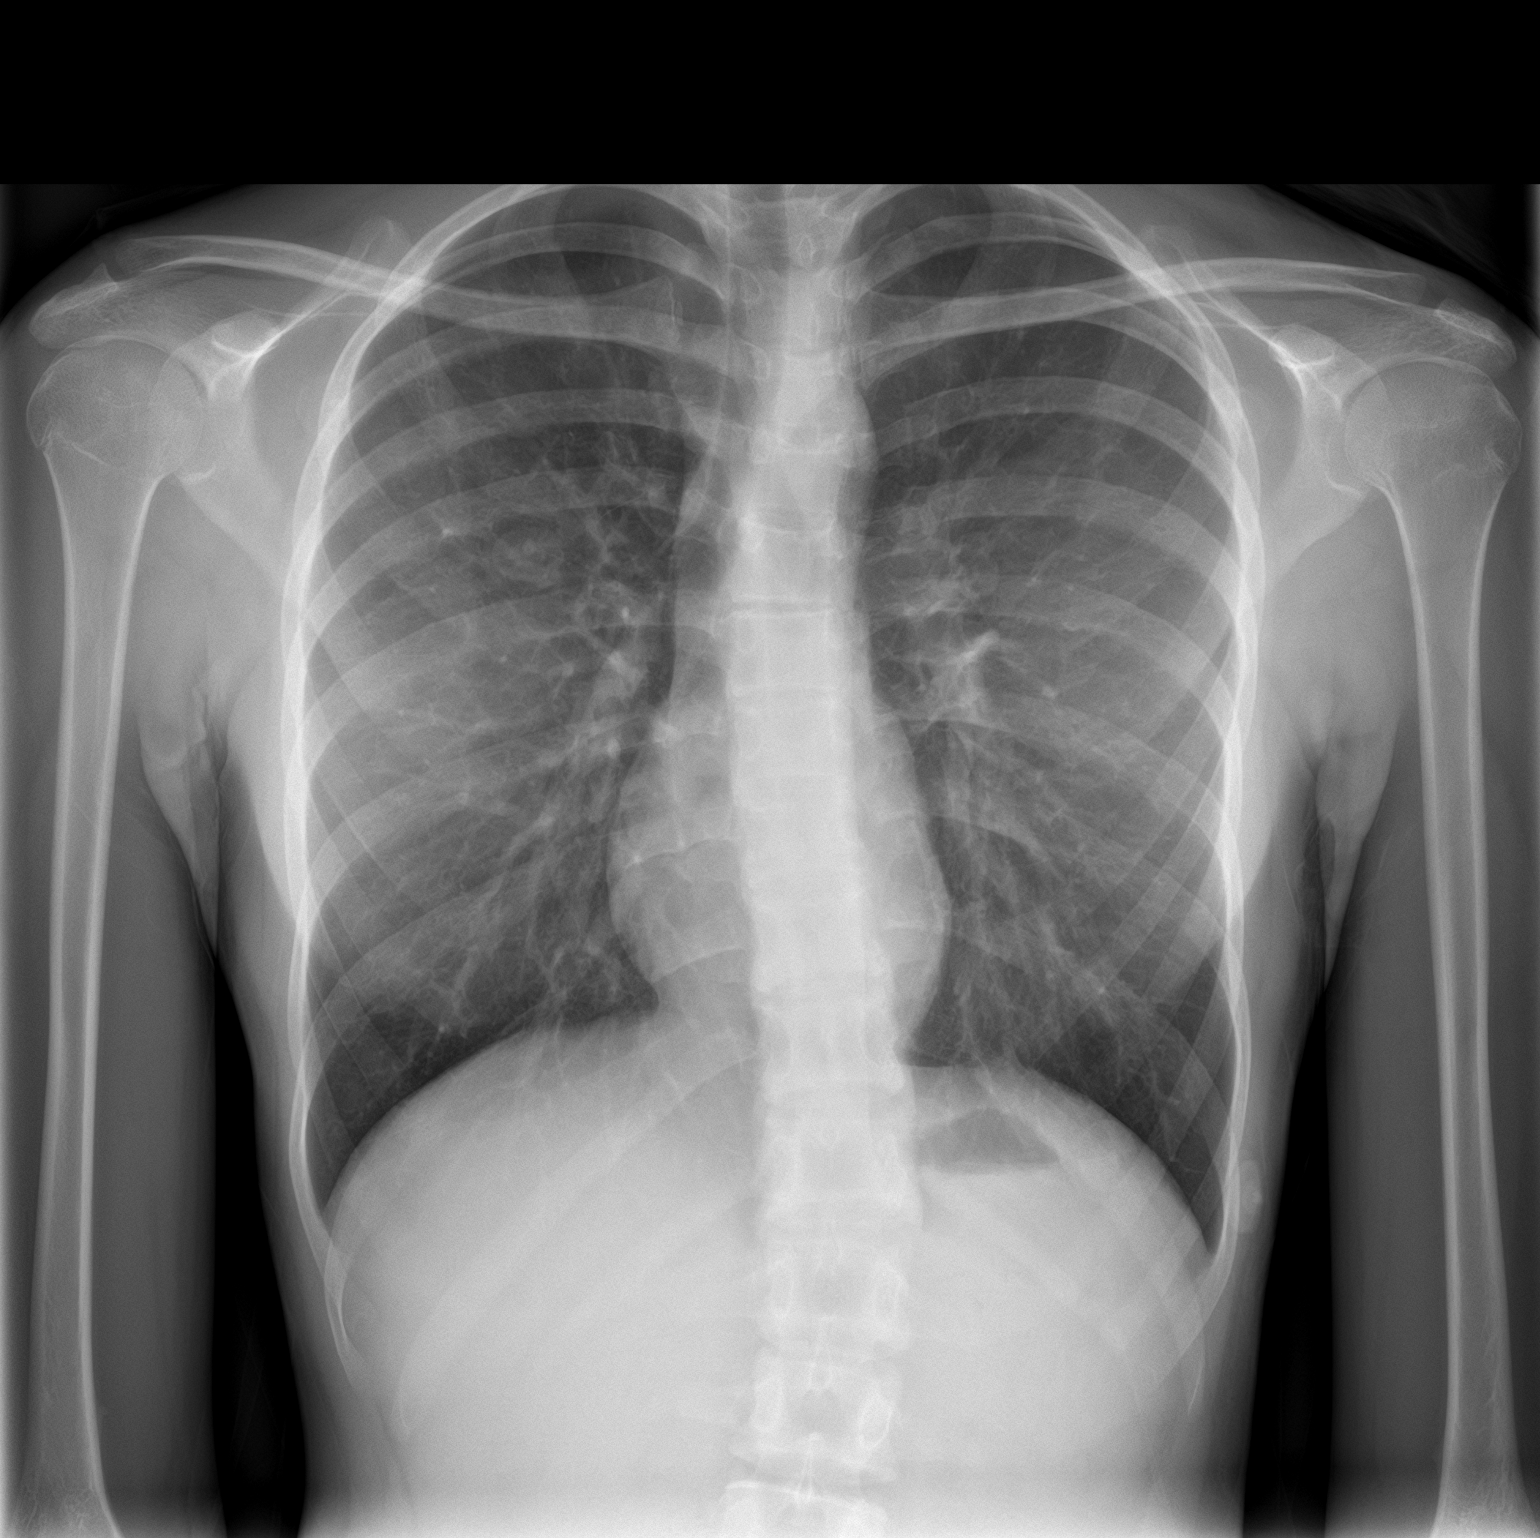

[chest lat]
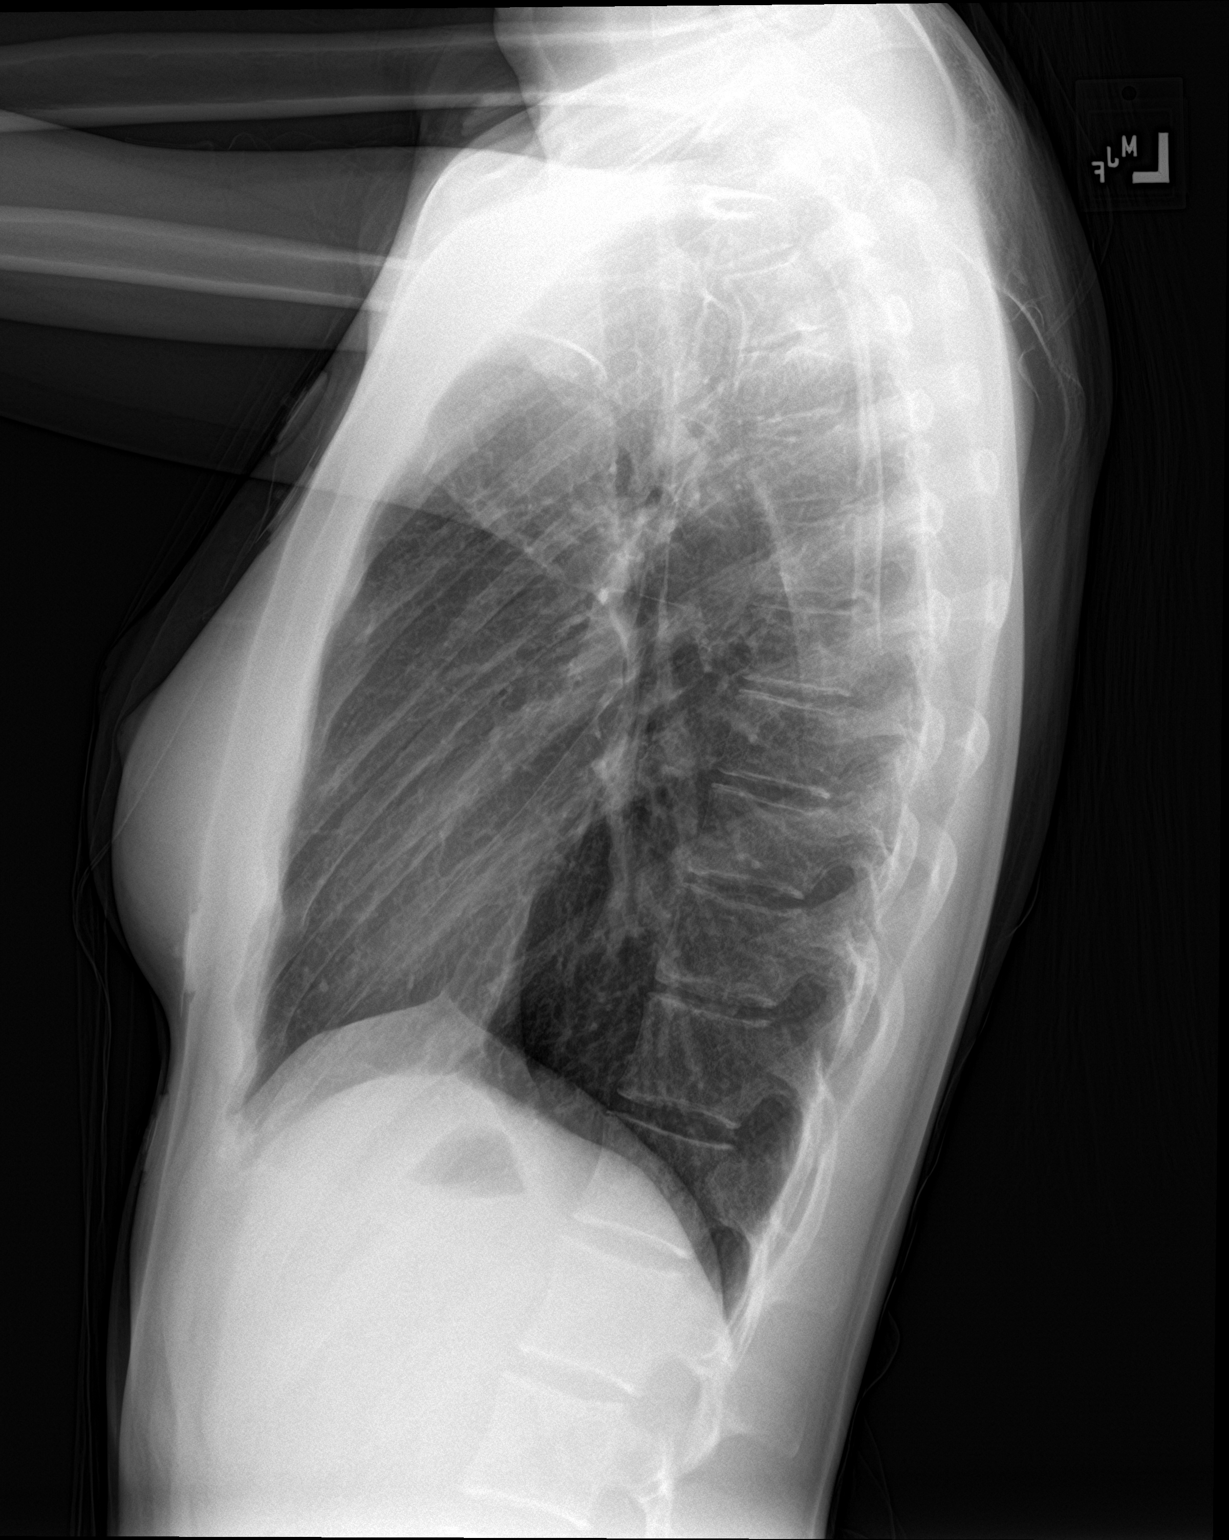

[2 of 2 positions shown; findings below may reference images not displayed]

FINDINGS: The heart size and mediastinal contours are within normal limits.
Both lungs are clear. There is mild S-shaped scoliosis of the
thoracolumbar spine.
IMPRESSION: No active cardiopulmonary disease.

## 2023-12-27 ENCOUNTER — Other Ambulatory Visit: Payer: Self-pay

## 2023-12-27 ENCOUNTER — Emergency Department: Payer: Self-pay

## 2023-12-27 ENCOUNTER — Emergency Department
Admission: EM | Admit: 2023-12-27 | Discharge: 2023-12-27 | Disposition: A | Discharge status: Home / Self Care | Payer: Self-pay | Attending: Emergency Medicine | Admitting: Emergency Medicine

## 2023-12-27 DIAGNOSIS — R0602 Shortness of breath: Secondary | ICD-10-CM | POA: Insufficient documentation

## 2023-12-27 DIAGNOSIS — R110 Nausea: Secondary | ICD-10-CM | POA: Insufficient documentation

## 2023-12-27 DIAGNOSIS — R1013 Epigastric pain: Secondary | ICD-10-CM | POA: Insufficient documentation

## 2023-12-27 HISTORY — DX: Disorder of kidney and ureter, unspecified: N28.9

## 2023-12-27 LAB — COMPREHENSIVE METABOLIC PANEL WITH GFR
ALT: 20 U/L (ref 0–44)
AST: 17 U/L (ref 15–41)
Albumin: 5.1 g/dL — ABNORMAL HIGH (ref 3.5–5.0)
Alkaline Phosphatase: 79 U/L (ref 38–126)
Anion gap: 12 (ref 5–15)
BUN: 9 mg/dL (ref 6–20)
CO2: 21 mmol/L — ABNORMAL LOW (ref 22–32)
Calcium: 9.9 mg/dL (ref 8.9–10.3)
Chloride: 106 mmol/L (ref 98–111)
Creatinine, Ser: 0.55 mg/dL (ref 0.44–1.00)
GFR, Estimated: >60 mL/min (ref >60)
Glucose, Bld: 99 mg/dL (ref 70–99)
Potassium: 3.5 mmol/L (ref 3.5–5.1)
Sodium: 139 mmol/L (ref 135–145)
Total Bilirubin: 1.4 mg/dL — ABNORMAL HIGH (ref 0.0–1.2)
Total Protein: 8.6 g/dL — ABNORMAL HIGH (ref 6.5–8.1)

## 2023-12-27 LAB — CBC
HCT: 39.8 % (ref 36.0–46.0)
Hemoglobin: 12.9 g/dL (ref 12.0–15.0)
MCH: 27.8 pg (ref 26.0–34.0)
MCHC: 32.4 g/dL (ref 30.0–36.0)
MCV: 85.8 fL (ref 80.0–100.0)
Platelets: 220 10*3/uL (ref 150–400)
RBC: 4.64 MIL/uL (ref 3.87–5.11)
RDW: 14.6 % (ref 11.5–15.5)
WBC: 5.7 10*3/uL (ref 4.0–10.5)
nRBC: 0 % (ref 0.0–0.2)

## 2023-12-27 LAB — URINALYSIS, ROUTINE W REFLEX MICROSCOPIC
Bilirubin Urine: NEGATIVE
Glucose, UA: NEGATIVE mg/dL
Hgb urine dipstick: NEGATIVE
Ketones, ur: 20 mg/dL — AB
Leukocytes,Ua: NEGATIVE
Nitrite: NEGATIVE
Protein, ur: 30 mg/dL — AB
Specific Gravity, Urine: 1.034 — ABNORMAL HIGH (ref 1.005–1.030)
pH: 5 (ref 5.0–8.0)

## 2023-12-27 LAB — POC URINE PREG, ED: Preg Test, Ur: NEGATIVE

## 2023-12-27 LAB — TROPONIN I (HIGH SENSITIVITY): Troponin I (High Sensitivity): <2 ng/L (ref <18)

## 2023-12-27 LAB — LIPASE, BLOOD: Lipase: 27 U/L (ref 11–51)

## 2023-12-27 MED ORDER — FAMOTIDINE 20 MG PO TABS: 20.0000 mg | ORAL_TABLET | Freq: Two times a day (BID) | ORAL | 0 refills | Status: AC

## 2023-12-27 MED ORDER — LACTATED RINGERS IV BOLUS: 1000.0000 mL | Freq: Once | INTRAVENOUS | Status: DC

## 2023-12-27 MED ORDER — LIDOCAINE VISCOUS HCL 2 % MT SOLN
15.0000 mL | Freq: Once | OROMUCOSAL | Status: DC
  Filled 2023-12-27: qty 15

## 2023-12-27 MED ORDER — ALUM & MAG HYDROXIDE-SIMETH 200-200-20 MG/5ML PO SUSP
30.0000 mL | Freq: Once | ORAL | Status: DC
  Filled 2023-12-27: qty 30

## 2023-12-27 NOTE — ED Triage Notes (Signed)
 Pt to ED for nausea since 3 weeks and abdominal discomfort (burning) since 1 week. Pt is 5 months pp and BF, no period since before birth. States whenever she eats she feels SOB, and that's also when nausea feels worse. Spanish speaking.

## 2023-12-27 NOTE — ED Notes (Signed)
 Pt refused tx ama. Pt was explained the medicines, IV fluids, and the blood draw. Interpreter stick utilized. Dr Waymond informed.

## 2023-12-27 NOTE — Discharge Instructions (Signed)
 You can take the Pepcid for your acid reflux.

## 2023-12-27 NOTE — ED Notes (Signed)
 See triage note  Presents with some stomach pain  States pain is worse after eating   No fever

## 2023-12-27 NOTE — ED Provider Notes (Addendum)
 [facility] Provider Note    Event Date/Time   First MD Initiated Contact with Patient 12/27/23 1116     (approximate)   History   Nausea and Abdominal Pain   HPI  Taylor Molina is a 18 y.o. female with history of anxiety, is 5 months postpartum, presenting with 3 weeks of nausea and 1 week of upper abdominal burning sensation.  She denies any vomiting or diarrhea, states that she ate whenever she tries to eat, she feels very nauseous, states that the nausea takes her breath away.  States that she has been having intermittent shortness of breath for years.  She denies any chest pain or cough.  No urinary symptoms.  No leg swelling.  She denies history of DVTs, no hemoptysis, no recent travel or surgeries, does not take any hormones.  States that she has not had a period since she gave birth.  She denies any cardiac issues.  On independent chart review, from a postpartum discharge summary in January of this year, has history of anxiety, had a spontaneous vaginal delivery on January 4, postpartum course was uncomplicated and she was discharged 2 days later.  Encounter was done with Spanish interpreter.     Physical Exam   Triage Vital Signs: ED Triage Vitals  Encounter Vitals Group     BP 12/27/23 1059 137/83     Girls Systolic BP Percentile --      Girls Diastolic BP Percentile --      Boys Systolic BP Percentile --      Boys Diastolic BP Percentile --      Pulse Rate 12/27/23 1059 (!) 110     Resp 12/27/23 1059 20     Temp 12/27/23 1059 98.2 F (36.8 C)     Temp Source 12/27/23 1059 Oral     SpO2 12/27/23 1059 100 %     Weight 12/27/23 1057 110 lb (49.9 kg)     Height 12/27/23 1057 5' 8 (1.727 m)     Head Circumference --      Peak Flow --      Pain Score 12/27/23 1053 4     Pain Loc --      Pain Education --      Exclude from Growth Chart --     Most recent vital signs: Vitals:   12/27/23 1059  BP: 137/83  Pulse: (!) 110   Resp: 20  Temp: 98.2 F (36.8 C)  SpO2: 100%     General: Awake, no distress.  CV:  Good peripheral perfusion.  Resp:  Normal effort.  Clear Abd:  No distention.  Nontender to palpation Other:  No lower extremity edema, no unilateral calf sign or tenderness   ED Results / Procedures / Treatments   Labs (all labs ordered are listed, but only abnormal results are displayed) Labs Reviewed  COMPREHENSIVE METABOLIC PANEL WITH GFR - Abnormal; Notable for the following components:      Result Value   CO2 21 (*)    Total Protein 8.6 (*)    Albumin 5.1 (*)    Total Bilirubin 1.4 (*)    All other components within normal limits  URINALYSIS, ROUTINE W REFLEX MICROSCOPIC - Abnormal; Notable for the following components:   Color, Urine YELLOW (*)    APPearance HAZY (*)    Specific Gravity, Urine 1.034 (*)    Ketones, ur 20 (*)    Protein, ur 30 (*)    Bacteria, UA RARE (*)  All other components within normal limits  LIPASE, BLOOD  CBC  D-DIMER, QUANTITATIVE (NOT AT [facility])  POC URINE PREG, ED  TROPONIN I (HIGH SENSITIVITY)    RADIOLOGY On my independent interpretation, chest x-ray without focal consolidation   PROCEDURES:  Critical Care performed: No  Procedures   MEDICATIONS ORDERED IN ED: Medications  alum & mag hydroxide-simeth (MAALOX/MYLANTA) 200-200-20 MG/5ML suspension 30 mL (30 mLs Oral Not Given 12/27/23 1205)    And  lidocaine  (XYLOCAINE ) 2 % viscous mouth solution 15 mL (15 mLs Oral Patient Refused/Not Given 12/27/23 1205)  lactated ringers  bolus 1,000 mL (1,000 mLs Intravenous Patient Refused/Not Given 12/27/23 1211)     IMPRESSION / MDM / ASSESSMENT AND PLAN / ED COURSE  I reviewed the triage vital signs and the nursing notes.                              Differential diagnosis includes, but is not limited to, GERD, acid reflux, peptic ulcer disease, pancreatitis, consider biliary etiology but she has no abdominal pain or jaundice, considered but less  likely atypical ACS, did consider PE, this is less likely, patient has no other risk factors but she is tachycardic, will get a D-dimer, EKG, labs, chest x-ray, GI cocktail, IV fluids.  Patient's presentation is most consistent with acute presentation with potential threat to life or bodily function.  Independent interpretation of labs and imaging below.  UA is a dirty sample, shows rare bacteria, 6-10 WBCs but no leukocytes or nitrates, patient has no urinary symptoms at this time, this is less likely a UTI and more likely a dirty catch.  Patient is refusing additional labs or workup, is refusing medications and EKG.  Had extensive discussion done with patient using Spanish interpreter, she states that she does not want to take any medications because she does not know if she will be allergic to them, discussed with her that she has no known allergies to Maalox or lidocaine  and that we cannot predict if she will be allergic to them but she is in the emergency department and we can intervene if she does have an allergic reaction to these meds.  Patient states that she does not want to stay or take the medications or IV fluids.  Also does not want to stay for rest of labs.  Understands risk of missing possible of blood clots, arrhythmia, cardiac issues if we do not complete the workup.  States that she understands the risks and would still like to go.  Would like a referral for primary care.  Patient is well-appearing, she is able to make her own medical decisions, we will discharge her with a prescription for Pepcid and refer her to primary care.  Considered but no indication for inpatient admission at this time, she safe for outpatient management.  Will discharge with strict precautions.   Clinical Course as of 12/27/23 1237  Sun Dec 27, 2023  1224 DG Chest 2 View No active cardiopulmonary disease.  [TT]  1224 Independent review of labs, no leukocytosis, electrolytes not severely deranged, T. bili is  mildly elevated but she is not jaundiced or have any right upper quadrant pain at this time, troponin is negative, lipase is normal. [TT]  1226 Pregnancy test was dipped, is negative. [TT]    Clinical Course User Index [TT] Waymond Lorelle Cummins, MD     FINAL CLINICAL IMPRESSION(S) / ED DIAGNOSES   Final diagnoses:  Nausea  Epigastric pain  Shortness of breath     Rx / DC Orders   ED Discharge Orders          Ordered    Ambulatory Referral to Primary Care (Establish Care)        12/27/23 1228    famotidine (PEPCID) 20 MG tablet  2 times daily        12/27/23 1228             Note:  This document was prepared using Dragon voice recognition software and may include unintentional dictation errors.    Waymond Lorelle Cummins, MD 12/27/23 1229    Waymond Lorelle Cummins, MD 12/27/23 [phone]

## 2024-05-17 ENCOUNTER — Ambulatory Visit: Payer: Self-pay

## 2024-05-17 ENCOUNTER — Ambulatory Visit (LOCAL_COMMUNITY_HEALTH_CENTER): Payer: Self-pay

## 2024-05-17 VITALS — BP 118/81 | HR 97 | Wt 106.2 lb

## 2024-05-17 DIAGNOSIS — Z3009 Encounter for other general counseling and advice on contraception: Secondary | ICD-10-CM

## 2024-05-17 DIAGNOSIS — Z30430 Encounter for insertion of intrauterine contraceptive device: Secondary | ICD-10-CM

## 2024-05-17 DIAGNOSIS — Z3202 Encounter for pregnancy test, result negative: Secondary | ICD-10-CM

## 2024-05-17 LAB — PREGNANCY, URINE: Preg Test, Ur: NEGATIVE

## 2024-05-17 MED ORDER — LEVONORGESTREL 20.1 MCG/DAY IU IUD
1.0000 | INTRAUTERINE_SYSTEM | Freq: Once | INTRAUTERINE | Status: AC
  Administered 2024-05-17: 1 via INTRAUTERINE

## 2024-05-17 NOTE — Progress Notes (Addendum)
 Taylor Molina [facility] [address] [place] [address] Main phone: [phone]  Family Planning Visit - Repeat Yearly Visit  Subjective:  Taylor Molina is a 18 y.o. G1P1001  being seen today for an annual wellness visit and to discuss contraception options. The patient is currently using no method - no contraceptive precautions for pregnancy prevention. Patient does not want a pregnancy in the next year.   Patient reports they are looking for a method with the following characteristics:  High efficacy at preventing pregnancy Minimal bleeding or improved bleeding profile Long term method  Patient has the following medical problems:  Patient Active Problem List   Diagnosis Date Noted   Uterine contractions 07/04/2023   Preterm uterine contractions in third trimester, antepartum 07/02/2023   Rh negative status during pregnancy 11/21/2022   Maternal varicella, non-immune 11/21/2022   Encounter for supervision of normal first pregnancy in first trimester 11/17/2022   Underweight BMI=16.7 11/17/2022   Anxiety dx'd 2022 11/17/2022   Chief Complaint  Patient presents with   Annual Exam    PE/IUD insertion   HPI Patient reports desire for IUD insertion. She had a vaginal delivery on 07/05/23. Reports things are going well at home with her baby.  Patient denies other concerns today. Declines STI testing today.  Review of Systems  All other systems reviewed and are negative.   See flowsheet for further details and programmatic requirements Hyperlink available at the top of the signed note in blue.  Flow sheet content below:  Pregnancy Intention Screening Does the patient want to become pregnant in the next year?: No Does the patient's partner want to become pregnant in the next year?: No Would the patient like to discuss contraceptive options today?: Yes Sexual History What age did you start your period?:  13 How often do you have your period?: irregular Date of last sex?: 04/17/24 Has the patient had unprotected sex within the last 5 days?: No Do you have sex with men, women, both men and women?: Men only In the past 2 months how many partners have you had sex with?: 1 In the past 12 months, how many partners have you had sex with?: 1 Is it possible that any of your sex partners in the past 12 months had sex with someone else whild they were still in a sexual relationship with you?: No What ways do you have sex?: Vaginal Do you or your partner use condoms and/or dental dams every time you have vaginal, oral or anal sex?: No Do you douche?: No Date of last HIV test?: 04/03/23 Have you ever had an STD?: No Have any of your partners had an STD?: No Have you or your partner ever shot up drugs?: No Have any of your partners used drugs in the past?: No Have you or your partners exchanged money or drugs for sex?: No  Diabetes screening This patient is 18 y.o. with a BMI of Body mass index is 16.15 kg/m.SABRA  Is patient eligible for diabetes screening (age >35 and BMI >25)?  no  Was Hgb A1c ordered? no  STI screening Patient reports 1 of partners in last year.  Does this patient desire STI screening?  No - declines Declines all blood tests.  Cervical Cancer Screening  Initiate cervical cancer screening at 19  Health Maintenance Due  Topic Date Due   HPV VACCINES (1 - 3-dose series) Never done   Meningococcal B Vaccine (1 of 2 -  Standard) Never done   Hepatitis B Vaccines 19-59 Average Risk (1 of 3 - 19+ 3-dose series) Never done   Influenza Vaccine  Never done   COVID-19 Vaccine (1 - 2025-26 season) Never done   The following portions of the patient's history were reviewed and updated as appropriate: allergies, current medications, past family history, past medical history, past social history, past surgical history and problem list. Problem list updated.  Objective:   Vitals:    05/17/24 1034  BP: 118/81  Pulse: 97  Weight: 106 lb 3.2 oz (48.2 kg)   Physical Exam Vitals and nursing note reviewed. Exam conducted with a chaperone present Calleen Northern).  Constitutional:      Appearance: Normal appearance.  HENT:     Head: Normocephalic and atraumatic.     Mouth/Throat:     Lips: Pink. No lesions.     Mouth: Mucous membranes are moist.  Pulmonary:     Effort: Pulmonary effort is normal.  Abdominal:     General: Abdomen is flat.     Palpations: Abdomen is soft.     Tenderness: There is no abdominal tenderness.  Genitourinary:    General: Normal vulva.     Exam position: Lithotomy position.     Pubic Area: No rash or pubic lice.      Labia:        Right: No rash or lesion.        Left: No rash or lesion.      Vagina: Normal. No vaginal discharge, erythema, bleeding or lesions.     Cervix: No cervical motion tenderness, discharge, friability, lesion or erythema.     Comments: IUD placed without complication Uterus sounded to 9 cm Lymphadenopathy:     Head:     Right side of head: No preauricular or posterior auricular adenopathy.     Left side of head: No preauricular or posterior auricular adenopathy.     Cervical: No cervical adenopathy.     Upper Body:     Right upper body: No supraclavicular, axillary or epitrochlear adenopathy.     Left upper body: No supraclavicular, axillary or epitrochlear adenopathy.     Lower Body: No right inguinal adenopathy. No left inguinal adenopathy.  Skin:    General: Skin is warm and dry.     Findings: No rash.  Neurological:     Mental Status: She is alert and oriented to person, place, and time.    Assessment and Plan:  Taylor Molina is a 18 y.o. female G1P1001 presenting to the [facility] for an yearly wellness and contraception visit  Family planning  Contraception counseling:  Reviewed options based on patient desire and reproductive life plan. Patient is interested in  IUD or IUS. This was provided to the patient today.  Risks, benefits, and typical effectiveness rates were reviewed.  Questions were answered.  Written information was also given to the patient to review.    The patient will follow up in  4 weeks for surveillance.  The patient was told to call with any further questions, or with any concerns about this method of contraception.  Emphasized use of condoms 100% of the time for STI prevention.  Emergency Contraception Precautions (ECP): Patient assessed for need of ECP. She is not a candidate based on no intercourse for ~1 month.  Educated on ECP and reviewed options.  Patient desires LNG-IUD.   2. Encounter for IUD insertion (Primary)  - Pregnancy, urine - After discussion of  copper versus LNG IUD, patient opted for Liletta - levonorgestrel (LILETTA) 20.1 MCG/DAY IUD 1 each   Procedure:  IUD Insertion  Patient presented to [facility] for IUD insertion. Her GC/CT screening was found to be up to date and using WHO criteria we can be reasonably certain she is not pregnant or a pregnancy test was obtained which was Urine pregnancy test  today was Negative.  See Flowsheet for IUD check list.   IUD Insertion Procedure Note Patient identified, informed consent performed, consent signed.   Discussed risks of irregular bleeding, cramping, infection, malpositioning or misplacement of the IUD outside the uterus which may require further procedure such as laparoscopy. Time out was performed.    Speculum placed in the vagina.  Cervix visualized.  Cleaned with Betadine x 2.  Grasped anteriorly with a single tooth tenaculum.  Uterus sounded to 9 cm.  IUD placed per manufacturer's recommendations.  Strings trimmed to 3 cm. Tenaculum was removed, good hemostasis noted.  Patient tolerated procedure well.   Patient was given post-procedure instructions- both agency handout and verbally by provider.  She was advised to have backup contraception for one week.  Patient  was also asked to check IUD strings periodically or follow up in 4 weeks for IUD check.   Due to language barrier, a Spanish interpreter Emmie SAILOR.) was present in person during the history-taking, subsequent discussion, and physical exam with this patient.    Return in about 4 weeks (around 06/14/2024) for string check if unable to feel strings.  No future appointments.  Damien FORBES Satchel, NP

## 2024-05-17 NOTE — Progress Notes (Signed)
 Pt is here for PE/IUD insertion. IUD inserted successfully into the Uterus by Damien Satchel, [facility] and pt tolerated well to the process with no complications. Opportunity given to pt to ask questions for any clarifications. Questions Answered. Condoms declined FP card given. Wilkie Drought, RN.
